# Patient Record
Sex: Female | Born: 1955 | Race: White | Hispanic: No | Marital: Single | Smoking: Never smoker
Health system: Southern US, Community
[De-identification: ages and names within clinical notes are randomized; demographics above are authoritative.]

## PROBLEM LIST (undated history)

## (undated) DIAGNOSIS — E782 Mixed hyperlipidemia: Secondary | ICD-10-CM

## (undated) DIAGNOSIS — Z86010 Personal history of colonic polyps: Secondary | ICD-10-CM

## (undated) DIAGNOSIS — K219 Gastro-esophageal reflux disease without esophagitis: Secondary | ICD-10-CM

## (undated) DIAGNOSIS — M1990 Unspecified osteoarthritis, unspecified site: Secondary | ICD-10-CM

## (undated) HISTORY — DX: Personal history of colonic polyps: Z86.010

## (undated) HISTORY — DX: Mixed hyperlipidemia: E78.2

## (undated) HISTORY — PX: TONSILLECTOMY AND ADENOIDECTOMY: SUR1326

## (undated) HISTORY — DX: Unspecified osteoarthritis, unspecified site: M19.90

## (undated) HISTORY — DX: Gastro-esophageal reflux disease without esophagitis: K21.9

---

## 2001-01-24 ENCOUNTER — Encounter: Admission: RE | Admit: 2001-01-24 | Discharge: 2001-01-24 | Payer: Self-pay | Admitting: Family Medicine

## 2001-01-24 ENCOUNTER — Encounter: Payer: Self-pay | Admitting: Family Medicine

## 2002-04-04 ENCOUNTER — Encounter: Admission: RE | Admit: 2002-04-04 | Discharge: 2002-04-04 | Payer: Self-pay | Admitting: Family Medicine

## 2002-04-04 ENCOUNTER — Encounter: Payer: Self-pay | Admitting: Family Medicine

## 2003-01-01 ENCOUNTER — Other Ambulatory Visit: Admission: RE | Admit: 2003-01-01 | Discharge: 2003-01-01 | Payer: Self-pay | Admitting: *Deleted

## 2004-10-03 HISTORY — PX: MELANOMA EXCISION: SHX5266

## 2005-02-16 ENCOUNTER — Ambulatory Visit (HOSPITAL_COMMUNITY): Admission: RE | Admit: 2005-02-16 | Discharge: 2005-02-16 | Payer: Self-pay | Admitting: Oncology

## 2005-03-13 ENCOUNTER — Ambulatory Visit (HOSPITAL_COMMUNITY): Admission: RE | Admit: 2005-03-13 | Discharge: 2005-03-13 | Payer: Self-pay | Admitting: Diagnostic Radiology

## 2007-04-04 ENCOUNTER — Encounter: Admission: RE | Admit: 2007-04-04 | Discharge: 2007-04-04 | Payer: Self-pay | Admitting: Family Medicine

## 2007-04-04 ENCOUNTER — Ambulatory Visit: Payer: Self-pay | Admitting: Family Medicine

## 2007-04-08 ENCOUNTER — Encounter: Admission: RE | Admit: 2007-04-08 | Discharge: 2007-04-08 | Payer: Self-pay | Admitting: Family Medicine

## 2008-11-19 ENCOUNTER — Other Ambulatory Visit: Admission: RE | Admit: 2008-11-19 | Discharge: 2008-11-19 | Payer: Self-pay | Admitting: Internal Medicine

## 2009-11-18 ENCOUNTER — Other Ambulatory Visit: Admission: RE | Admit: 2009-11-18 | Discharge: 2009-11-18 | Payer: Self-pay | Admitting: Internal Medicine

## 2011-06-28 ENCOUNTER — Other Ambulatory Visit: Payer: Self-pay | Admitting: Family Medicine

## 2011-06-28 DIAGNOSIS — Z1231 Encounter for screening mammogram for malignant neoplasm of breast: Secondary | ICD-10-CM

## 2011-06-29 ENCOUNTER — Ambulatory Visit
Admission: RE | Admit: 2011-06-29 | Discharge: 2011-06-29 | Disposition: A | Discharge status: Home / Self Care | Payer: 59 | Source: Ambulatory Visit | Attending: Family Medicine | Admitting: Family Medicine

## 2011-06-29 DIAGNOSIS — Z1231 Encounter for screening mammogram for malignant neoplasm of breast: Secondary | ICD-10-CM

## 2012-09-07 ENCOUNTER — Ambulatory Visit
Admission: RE | Admit: 2012-09-07 | Discharge: 2012-09-07 | Disposition: A | Discharge status: Home / Self Care | Payer: 59 | Source: Ambulatory Visit | Attending: Family Medicine | Admitting: Family Medicine

## 2012-09-07 ENCOUNTER — Other Ambulatory Visit: Payer: Self-pay | Admitting: Family Medicine

## 2012-09-07 DIAGNOSIS — R059 Cough, unspecified: Secondary | ICD-10-CM

## 2012-09-07 DIAGNOSIS — R062 Wheezing: Secondary | ICD-10-CM

## 2012-09-07 DIAGNOSIS — R05 Cough: Secondary | ICD-10-CM

## 2013-04-15 ENCOUNTER — Other Ambulatory Visit: Payer: Self-pay

## 2013-04-15 DIAGNOSIS — Z1231 Encounter for screening mammogram for malignant neoplasm of breast: Secondary | ICD-10-CM

## 2013-04-17 ENCOUNTER — Other Ambulatory Visit: Payer: Self-pay | Admitting: Family Medicine

## 2013-04-17 ENCOUNTER — Ambulatory Visit
Admission: RE | Admit: 2013-04-17 | Discharge: 2013-04-17 | Disposition: A | Discharge status: Home / Self Care | Payer: 59 | Source: Ambulatory Visit

## 2013-04-17 DIAGNOSIS — Z1231 Encounter for screening mammogram for malignant neoplasm of breast: Secondary | ICD-10-CM

## 2014-05-23 ENCOUNTER — Other Ambulatory Visit: Payer: Self-pay

## 2014-05-23 DIAGNOSIS — Z1231 Encounter for screening mammogram for malignant neoplasm of breast: Secondary | ICD-10-CM

## 2014-05-29 ENCOUNTER — Ambulatory Visit
Admission: RE | Admit: 2014-05-29 | Discharge: 2014-05-29 | Disposition: A | Discharge status: Home / Self Care | Payer: 59 | Source: Ambulatory Visit

## 2014-05-29 DIAGNOSIS — Z1231 Encounter for screening mammogram for malignant neoplasm of breast: Secondary | ICD-10-CM

## 2016-03-16 ENCOUNTER — Other Ambulatory Visit: Payer: Self-pay | Admitting: Family Medicine

## 2016-03-16 DIAGNOSIS — Z1231 Encounter for screening mammogram for malignant neoplasm of breast: Secondary | ICD-10-CM

## 2016-03-30 ENCOUNTER — Ambulatory Visit
Admission: RE | Admit: 2016-03-30 | Discharge: 2016-03-30 | Disposition: A | Discharge status: Home / Self Care | Payer: 59 | Source: Ambulatory Visit | Attending: Family Medicine | Admitting: Family Medicine

## 2016-03-30 DIAGNOSIS — Z1231 Encounter for screening mammogram for malignant neoplasm of breast: Secondary | ICD-10-CM | POA: Diagnosis not present

## 2016-06-29 DIAGNOSIS — D2272 Melanocytic nevi of left lower limb, including hip: Secondary | ICD-10-CM | POA: Diagnosis not present

## 2016-06-29 DIAGNOSIS — B078 Other viral warts: Secondary | ICD-10-CM | POA: Diagnosis not present

## 2016-06-29 DIAGNOSIS — L814 Other melanin hyperpigmentation: Secondary | ICD-10-CM | POA: Diagnosis not present

## 2016-06-29 DIAGNOSIS — D692 Other nonthrombocytopenic purpura: Secondary | ICD-10-CM | POA: Diagnosis not present

## 2016-06-29 DIAGNOSIS — D1801 Hemangioma of skin and subcutaneous tissue: Secondary | ICD-10-CM | POA: Diagnosis not present

## 2016-06-29 DIAGNOSIS — L821 Other seborrheic keratosis: Secondary | ICD-10-CM | POA: Diagnosis not present

## 2016-07-27 DIAGNOSIS — L820 Inflamed seborrheic keratosis: Secondary | ICD-10-CM | POA: Diagnosis not present

## 2016-07-27 DIAGNOSIS — B078 Other viral warts: Secondary | ICD-10-CM | POA: Diagnosis not present

## 2016-07-27 DIAGNOSIS — D485 Neoplasm of uncertain behavior of skin: Secondary | ICD-10-CM | POA: Diagnosis not present

## 2016-09-07 DIAGNOSIS — H52223 Regular astigmatism, bilateral: Secondary | ICD-10-CM | POA: Diagnosis not present

## 2016-09-07 DIAGNOSIS — H5213 Myopia, bilateral: Secondary | ICD-10-CM | POA: Diagnosis not present

## 2016-12-07 DIAGNOSIS — H40043 Steroid responder, bilateral: Secondary | ICD-10-CM | POA: Diagnosis not present

## 2016-12-07 DIAGNOSIS — H40053 Ocular hypertension, bilateral: Secondary | ICD-10-CM | POA: Diagnosis not present

## 2017-03-08 ENCOUNTER — Ambulatory Visit (AMBULATORY_SURGERY_CENTER): Payer: Self-pay | Admitting: *Deleted

## 2017-03-08 VITALS — Ht 69.0 in | Wt 195.0 lb

## 2017-03-08 DIAGNOSIS — Z1211 Encounter for screening for malignant neoplasm of colon: Secondary | ICD-10-CM

## 2017-03-08 NOTE — Progress Notes (Signed)
Denies allergies to eggs or soy products. Denies complications with sedation or anesthesia. Denies O2 use. Denies use of diet or weight loss medications.  Emmi instructions given for colonoscopy.  

## 2017-03-09 ENCOUNTER — Encounter: Payer: Self-pay | Admitting: Internal Medicine

## 2017-03-22 ENCOUNTER — Ambulatory Visit (AMBULATORY_SURGERY_CENTER): Payer: 59 | Admitting: Internal Medicine

## 2017-03-22 ENCOUNTER — Encounter: Payer: Self-pay | Admitting: Internal Medicine

## 2017-03-22 VITALS — BP 142/82 | HR 87 | Temp 97.7°F | Resp 12 | Ht 69.0 in | Wt 195.0 lb

## 2017-03-22 DIAGNOSIS — Z1211 Encounter for screening for malignant neoplasm of colon: Secondary | ICD-10-CM

## 2017-03-22 DIAGNOSIS — D123 Benign neoplasm of transverse colon: Secondary | ICD-10-CM

## 2017-03-22 DIAGNOSIS — Z1212 Encounter for screening for malignant neoplasm of rectum: Secondary | ICD-10-CM | POA: Diagnosis not present

## 2017-03-22 DIAGNOSIS — D122 Benign neoplasm of ascending colon: Secondary | ICD-10-CM | POA: Diagnosis not present

## 2017-03-22 DIAGNOSIS — D120 Benign neoplasm of cecum: Secondary | ICD-10-CM | POA: Diagnosis not present

## 2017-03-22 MED ORDER — SODIUM CHLORIDE 0.9 % IV SOLN: 500.0000 mL | INTRAVENOUS | Status: DC

## 2017-03-22 NOTE — Progress Notes (Signed)
Called to room to assist during endoscopic procedure.  Patient ID and intended procedure confirmed with present staff. Received instructions for my participation in the procedure from the performing physician.  

## 2017-03-22 NOTE — Patient Instructions (Addendum)
I found and removed 10 polyps - largest 15 mm. All look benign. I will let you know pathology results and when to have another routine colonoscopy by mail and/or My Chart.   You also have a condition called diverticulosis - common and not usually a problem. Please read the handout provided.  I appreciate the opportunity to care for you. Gatha Mayer, MD, FACG YOU HAD AN ENDOSCOPIC PROCEDURE TODAY AT [facility]:   Refer to the procedure report that was given to you for any specific questions about what was found during the examination.  If the procedure report does not answer your questions, please call your gastroenterologist to clarify.  If you requested that your care partner not be given the details of your procedure findings, then the procedure report has been included in a sealed envelope for you to review at your convenience later.  YOU SHOULD EXPECT: Some feelings of bloating in the abdomen. Passage of more gas than usual.  Walking can help get rid of the air that was put into your GI tract during the procedure and reduce the bloating. If you had a lower endoscopy (such as a colonoscopy or flexible sigmoidoscopy) you may notice spotting of blood in your stool or on the toilet paper. If you underwent a bowel prep for your procedure, you may not have a normal bowel movement for a few days.  Please Note:  You might notice some irritation and congestion in your nose or some drainage.  This is from the oxygen used during your procedure.  There is no need for concern and it should clear up in a day or so.  SYMPTOMS TO REPORT IMMEDIATELY:   Following lower endoscopy (colonoscopy or flexible sigmoidoscopy):  Excessive amounts of blood in the stool  Significant tenderness or worsening of abdominal pains  Swelling of the abdomen that is new, acute  Fever of 100F or higher  For urgent or emergent issues, a gastroenterologist can be reached at any hour by calling [phone]  [phone].   DIET:  We do recommend a small meal at first, but then you may proceed to your regular diet.  Drink plenty of fluids but you should avoid alcoholic beverages for 24 hours.  MEDICATIONS: Continue present medications.  Please see handouts given to you by your recovery nurse.  ACTIVITY:  You should plan to take it easy for the rest of today and you should NOT DRIVE or use heavy machinery until tomorrow (because of the sedation medicines used during the test).    FOLLOW UP: Our staff will call the number listed on your records the next business day following your procedure to check on you and address any questions or concerns that you may have regarding the information given to you following your procedure. If we do not reach you, we will leave a message.  However, if you are feeling well and you are not experiencing any problems, there is no need to return our call.  We will assume that you have returned to your regular daily activities without incident.  If any biopsies were taken you will be contacted by phone or by letter within the next 1-3 weeks.  Please call us at [phone] if you have not heard about the biopsies in 3 weeks.   Thank you for allowing [place] to provide for your healthcare needs today.   SIGNATURES/CONFIDENTIALITY: You and/or your care partner have signed paperwork which will be entered into your electronic medical  record.  These signatures attest to the fact that that the information above on your After Visit Summary has been reviewed and is understood.  Full responsibility of the confidentiality of this discharge information lies with you and/or your care-partner. 

## 2017-03-22 NOTE — Progress Notes (Signed)
Alert and oriented x3, pleased with MAC, report to RN Sarah 

## 2017-03-22 NOTE — Op Note (Signed)
[place] Patient Name: Jessica Martin Procedure Date: 03/22/2017 1:27 PM MRN: [id number] Endoscopist: Gatha Mayer , MD Age: 61 Referring MD:  Date of Birth: 1955-10-17 Gender: Female Account #: [id number] Procedure:                Colonoscopy Indications:              Screening for colorectal malignant neoplasm, This                            is the patient's first colonoscopy Medicines:                Propofol per Anesthesia, Monitored Anesthesia Care Procedure:                Pre-Anesthesia Assessment:                           - Prior to the procedure, a History and Physical                            was performed, and patient medications and                            allergies were reviewed. The patient's tolerance of                            previous anesthesia was also reviewed. The risks                            and benefits of the procedure and the sedation                            options and risks were discussed with the patient.                            All questions were answered, and informed consent                            was obtained. Prior Anticoagulants: The patient has                            taken no previous anticoagulant or antiplatelet                            agents. ASA Grade Assessment: II - A patient with                            mild systemic disease. After reviewing the risks                            and benefits, the patient was deemed in                            satisfactory condition to undergo the procedure.  After obtaining informed consent, the colonoscope                            was passed under direct vision. Throughout the                            procedure, the patient's blood pressure, pulse, and                            oxygen saturations were monitored continuously. The                            Colonoscope was introduced through the anus and   advanced to the the cecum, identified by                            appendiceal orifice and ileocecal valve. The                            colonoscopy was performed without difficulty. The                            patient tolerated the procedure well. The quality                            of the bowel preparation was good. The ileocecal                            valve, appendiceal orifice, and rectum were                            photographed. Scope In: 1:38:33 PM Scope Out: 2:06:51 PM Scope Withdrawal Time: 0 hours 23 minutes 32 seconds  Total Procedure Duration: 0 hours 28 minutes 18 seconds  Findings:                 The perianal and digital rectal examinations were                            normal.                           Seven flat and sessile polyps were found in the                            transverse colon, ascending colon and cecum. The                            polyps were 5 to 15 mm in size. These polyps were                            removed with a cold snare. Resection and retrieval                            were complete. Verification of patient  identification for the specimen was done. Estimated                            blood loss was minimal.                           Three sessile polyps were found in the cecum. The                            polyps were 2 to 3 mm in size. These polyps were                            removed with a cold biopsy forceps. Resection and                            retrieval were complete. Verification of patient                            identification for the specimen was done. Estimated                            blood loss was minimal.                           Many small and large-mouthed diverticula were found                            in the sigmoid colon. There was narrowing of the                            colon in association with the diverticular opening.                            Scattered diverticula were found in the right colon.                           The exam was otherwise without abnormality on                            direct and retroflexion views. Complications:            No immediate complications. Estimated Blood Loss:     Estimated blood loss was minimal. Impression:               - Seven 5 to 15 mm polyps in the transverse colon,                            in the ascending colon and in the cecum, removed                            with a cold snare. Resected and retrieved.                           - Three 2 to 3  mm polyps in the cecum, removed with                            a cold biopsy forceps. Resected and retrieved.                           - Severe diverticulosis in the sigmoid colon. There                            was narrowing of the colon in association with the                            diverticular opening.                           - Diverticulosis in the right colon.                           - The examination was otherwise normal on direct                            and retroflexion views. Recommendation:           - Patient has a contact number available for                            emergencies. The signs and symptoms of potential                            delayed complications were discussed with the                            patient. Return to normal activities tomorrow.                            Written discharge instructions were provided to the                            patient.                           - Resume previous diet.                           - Continue present medications.                           - Repeat colonoscopy is recommended for                            surveillance. The colonoscopy date will be                            determined after pathology results from today's  exam become available for review. Gatha Mayer, MD 03/22/2017 2:18:38 PM This report has been  signed electronically.

## 2017-03-23 ENCOUNTER — Telehealth: Payer: Self-pay | Admitting: *Deleted

## 2017-03-23 NOTE — Telephone Encounter (Signed)
  Follow up Call-  Call back number 03/22/2017  Post procedure Call Back phone  # [phone]  Permission to leave phone message Yes  Some recent data might be hidden     Patient questions:  Do you have a fever, pain , or abdominal swelling? No. Pain Score  0 *  Have you tolerated food without any problems? Yes.    Have you been able to return to your normal activities? Yes.    Do you have any questions about your discharge instructions: Diet   No. Medications  No. Follow up visit  No.  Do you have questions or concerns about your Care? No.  Actions: * If pain score is 4 or above: No action needed, pain <4.

## 2017-03-23 NOTE — Telephone Encounter (Signed)
  Follow up Call-  Call back number 03/22/2017  Post procedure Call Back phone  # [phone]  Permission to leave phone message Yes  Some recent data might be hidden     No answer, left message.

## 2017-03-29 ENCOUNTER — Encounter: Payer: Self-pay | Admitting: Internal Medicine

## 2017-03-29 DIAGNOSIS — Z860101 Personal history of adenomatous and serrated colon polyps: Secondary | ICD-10-CM | POA: Insufficient documentation

## 2017-03-29 DIAGNOSIS — Z86010 Personal history of colonic polyps: Secondary | ICD-10-CM | POA: Insufficient documentation

## 2017-03-29 HISTORY — DX: Personal history of adenomatous and serrated colon polyps: Z86.0101

## 2017-03-29 HISTORY — DX: Personal history of colonic polyps: Z86.010

## 2017-05-31 DIAGNOSIS — H40013 Open angle with borderline findings, low risk, bilateral: Secondary | ICD-10-CM | POA: Diagnosis not present

## 2017-06-28 ENCOUNTER — Other Ambulatory Visit: Payer: Self-pay | Admitting: Family Medicine

## 2017-06-28 DIAGNOSIS — Z1239 Encounter for other screening for malignant neoplasm of breast: Secondary | ICD-10-CM

## 2017-07-12 ENCOUNTER — Ambulatory Visit
Admission: RE | Admit: 2017-07-12 | Discharge: 2017-07-12 | Disposition: A | Discharge status: Home / Self Care | Payer: 59 | Source: Ambulatory Visit | Attending: Family Medicine | Admitting: Family Medicine

## 2017-07-12 DIAGNOSIS — Z1231 Encounter for screening mammogram for malignant neoplasm of breast: Secondary | ICD-10-CM | POA: Diagnosis not present

## 2017-07-12 DIAGNOSIS — Z1239 Encounter for other screening for malignant neoplasm of breast: Secondary | ICD-10-CM

## 2017-11-16 DIAGNOSIS — H40013 Open angle with borderline findings, low risk, bilateral: Secondary | ICD-10-CM | POA: Diagnosis not present

## 2017-11-16 DIAGNOSIS — H5213 Myopia, bilateral: Secondary | ICD-10-CM | POA: Diagnosis not present

## 2017-11-16 DIAGNOSIS — H52221 Regular astigmatism, right eye: Secondary | ICD-10-CM | POA: Diagnosis not present

## 2017-11-16 DIAGNOSIS — H40053 Ocular hypertension, bilateral: Secondary | ICD-10-CM | POA: Diagnosis not present

## 2017-11-16 DIAGNOSIS — H25013 Cortical age-related cataract, bilateral: Secondary | ICD-10-CM | POA: Diagnosis not present

## 2018-01-05 ENCOUNTER — Other Ambulatory Visit: Payer: Self-pay | Admitting: Occupational Medicine

## 2018-01-05 LAB — CBC WITH DIFFERENTIAL/PLATELET
BASOS PCT: 1.1 %
Basophils Absolute: 62 cells/uL (ref 0–200)
Eosinophils Absolute: 241 cells/uL (ref 15–500)
Eosinophils Relative: 4.3 %
HEMATOCRIT: 41.6 % (ref 38.5–45.0)
Hemoglobin: 14.1 g/dL (ref 13.2–15.5)
LYMPHS ABS: 2083 {cells}/uL (ref 850–3900)
MCH: 30.3 pg (ref 27.0–33.0)
MCHC: 33.9 g/dL (ref 32.0–36.0)
MCV: 89.3 fL (ref 80.0–100.0)
MONOS PCT: 9.3 %
MPV: 9.7 fL (ref 7.5–12.5)
Neutro Abs: 2694 cells/uL (ref 1500–7800)
Neutrophils Relative %: 48.1 %
Platelets: 306 10*3/uL (ref 140–400)
RBC: 4.66 10*6/uL (ref 4.20–5.10)
RDW: 12.2 % (ref 11.0–15.0)
Total Lymphocyte: 37.2 %
WBC mixed population: 521 cells/uL (ref 200–950)
WBC: 5.6 10*3/uL (ref 3.8–10.8)

## 2018-01-05 LAB — COMPLETE METABOLIC PANEL WITH GFR
AG Ratio: 2 (calc) (ref 1.0–2.5)
ALBUMIN MSPROF: 4.3 g/dL (ref 3.6–5.1)
ALT: 13 U/L
AST: 15 U/L (ref 10–35)
Alkaline phosphatase (APISO): 42 U/L (ref 33–130)
BUN: 18 mg/dL (ref 7–25)
CALCIUM: 9.4 mg/dL
CO2: 30 mmol/L (ref 20–32)
CREATININE: 0.9 mg/dL
Chloride: 104 mmol/L (ref 98–110)
Globulin: 2.1 g/dL (calc) (ref 1.9–3.7)
Glucose, Bld: 83 mg/dL (ref 65–99)
POTASSIUM: 4.1 mmol/L (ref 3.5–5.3)
Sodium: 139 mmol/L (ref 135–146)
Total Bilirubin: 1.6 mg/dL — ABNORMAL HIGH (ref 0.2–1.2)
Total Protein: 6.4 g/dL (ref 6.1–8.1)

## 2018-01-05 LAB — LIPID PANEL
CHOL/HDL RATIO: 2.6 (calc) (ref <5.0)
Cholesterol: 210 mg/dL — ABNORMAL HIGH (ref <200)
HDL: 82 mg/dL
LDL CHOLESTEROL (CALC): 106 mg/dL — AB
NON-HDL CHOLESTEROL (CALC): 128 mg/dL (ref <130)
Triglycerides: 123 mg/dL (ref <150)

## 2018-01-05 LAB — TSH: TSH: 1.65 mIU/L (ref 0.40–4.50)

## 2018-02-13 ENCOUNTER — Encounter: Payer: Self-pay | Admitting: Family Medicine

## 2018-03-19 ENCOUNTER — Encounter: Payer: Self-pay | Admitting: Obstetrics & Gynecology

## 2018-03-19 ENCOUNTER — Ambulatory Visit (INDEPENDENT_AMBULATORY_CARE_PROVIDER_SITE_OTHER): Payer: 59 | Admitting: Obstetrics & Gynecology

## 2018-03-19 ENCOUNTER — Other Ambulatory Visit: Payer: Self-pay

## 2018-03-19 ENCOUNTER — Other Ambulatory Visit (HOSPITAL_COMMUNITY)
Admission: RE | Admit: 2018-03-19 | Discharge: 2018-03-19 | Disposition: A | Discharge status: Home / Self Care | Payer: 59 | Source: Ambulatory Visit | Attending: Obstetrics & Gynecology | Admitting: Obstetrics & Gynecology

## 2018-03-19 VITALS — BP 108/62 | HR 60 | Resp 16 | Ht 66.75 in | Wt 182.6 lb

## 2018-03-19 DIAGNOSIS — E2839 Other primary ovarian failure: Secondary | ICD-10-CM | POA: Diagnosis not present

## 2018-03-19 DIAGNOSIS — Z0000 Encounter for general adult medical examination without abnormal findings: Secondary | ICD-10-CM

## 2018-03-19 DIAGNOSIS — Z124 Encounter for screening for malignant neoplasm of cervix: Secondary | ICD-10-CM

## 2018-03-19 DIAGNOSIS — Z23 Encounter for immunization: Secondary | ICD-10-CM | POA: Diagnosis not present

## 2018-03-19 DIAGNOSIS — Z205 Contact with and (suspected) exposure to viral hepatitis: Secondary | ICD-10-CM | POA: Diagnosis not present

## 2018-03-19 DIAGNOSIS — Z01419 Encounter for gynecological examination (general) (routine) without abnormal findings: Secondary | ICD-10-CM | POA: Diagnosis not present

## 2018-03-19 NOTE — Progress Notes (Signed)
62 y.o. G0 SingleCaucasianF here for annual exam.  Doing well.  Has some urge urinary incontinence.  Not interested in treatment at this time.  Denies vaginal bleeding.  Went through menopause at age 31.  Never took HRT.  Here to get "updated" with gyn exam.  Patient's last menstrual period was 10/03/2001 (approximate).          Sexually active: No.  The current method of family planning is abstinence and post menopausal status.    Exercising: Yes.    Walking Smoker:  no  Health Maintenance: Pap:  11/19/08 Atypical Squamous cell.  History of abnormal Pap:  yes MMG:  07/12/17 BIRADS1:Neg  Colonoscopy:  03/22/17 Polyps. F/u this year.  Had 10 adenomatous polyp.   BMD:   Never TDaP:  2009 Pneumonia vaccine(s):  No Shingrix:   No Hep C testing: No Screening Labs: done   reports that she has never smoked. She has never used smokeless tobacco. She reports that she does not use drugs.  Past Medical History:  Diagnosis Date  . 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 GERD (gastroesophageal reflux disease)   . Hx of adenomatous colonic polyps 03/29/2017  . Mixed hyperlipidemia     Past Surgical History:  Procedure Laterality Date  . MELANOMA EXCISION  2006   right neck  . TONSILLECTOMY AND ADENOIDECTOMY  age 33    Current Outpatient Medications  Medication Sig Dispense Refill  . acetaminophen (TYLENOL) 500 MG chewable tablet Chew 500 mg by mouth every 6 (six) hours as needed for pain.    Marland Kitchen ALPRAZolam (XANAX) 0.5 MG tablet Take 0.5 mg by mouth 3 (three) times daily as needed.  2  . rosuvastatin (CRESTOR) 10 MG tablet Take 10 mg by mouth daily.    . Vilazodone HCl (VIIBRYD) 20 MG TABS Take 1 tablet by mouth daily.     Current Facility-Administered Medications  Medication Dose Route Frequency Provider Last Rate Last Dose  . 0.9 %  sodium chloride infusion  500 mL Intravenous Continuous Gatha Mayer, MD        Family History  Problem Relation Age of  Onset  . Lung cancer Mother   . Colon cancer Other 9  . Colon cancer Brother     Review of Systems  All other systems reviewed and are negative.   Exam:   BP 108/62 (BP Location: Right Arm, Patient Position: Sitting, Cuff Size: Large)   Pulse 60   Resp 16   Ht 5' 6.75" (1.695 m)   Wt 182 lb 9.6 oz (82.8 kg)   LMP 10/03/2001 (Approximate)   BMI 28.81 kg/m  Height: 5' 6.75" (169.5 cm)  Ht Readings from Last 3 Encounters:  03/19/18 5' 6.75" (1.695 m)  03/22/17 5\' 9"  (1.753 m)  03/08/17 5\' 9"  (1.753 m)    General appearance: alert, cooperative and appears stated age Head: Normocephalic, without obvious abnormality, atraumatic Neck: no adenopathy, supple, symmetrical, trachea midline and thyroid normal to inspection and palpation Lungs: clear to auscultation bilaterally Breasts: normal appearance, no masses or tenderness Heart: regular rate and rhythm Abdomen: soft, non-tender; bowel sounds normal; no masses,  no organomegaly Extremities: extremities normal, atraumatic, no cyanosis or edema Skin: Skin color, texture, turgor normal. No rashes or lesions Lymph nodes: Cervical, supraclavicular, and axillary nodes normal. No abnormal inguinal nodes palpated Neurologic: Grossly normal  Pelvic: External genitalia:  no lesions  Urethra:  normal appearing urethra with no masses, tenderness or lesions              Bartholins and Skenes: normal                 Vagina: normal appearing vagina with normal color and discharge, no lesions              Cervix: no lesions              Pap taken: Yes.   Bimanual Exam:  Uterus:  normal size, contour, position, consistency, mobility, non-tender              Adnexa: normal adnexa and no mass, fullness, tenderness               Rectovaginal: Confirms               Anus:  normal sphincter tone, no lesions  Chaperone was present for exam.  A:  Well Woman with normal exam PMP, no HRT Mild urinary incontinence Elevated  lipids H/O adenomatous polyps of the colon  P:   Mammogram guidelines reviewed.  UTD. Colonoscopy UTD with Dr. Carlean Purl pap smear and HR HPV obtained today Blood work done at [facility].  Copies provided.  Hep C antibody obtained today.  Also checking Vit D and hepatic panel (due to elevated bilirubin level previously) Discussed doing BMD with next MMG.  Order placed. Tdap updated today.  Shingrix vaccination discussed. Return annually or prn

## 2018-03-20 LAB — HEPATIC FUNCTION PANEL
ALBUMIN: 4.3 g/dL (ref 3.6–4.8)
ALK PHOS: 42 IU/L (ref 39–117)
ALT: 14 IU/L (ref 0–32)
AST: 15 IU/L (ref 0–40)
Bilirubin Total: 0.6 mg/dL (ref 0.0–1.2)
Bilirubin, Direct: 0.14 mg/dL (ref 0.00–0.40)
Total Protein: 6.5 g/dL (ref 6.0–8.5)

## 2018-03-20 LAB — HEPATITIS C ANTIBODY

## 2018-03-20 LAB — VITAMIN D 25 HYDROXY (VIT D DEFICIENCY, FRACTURES): Vit D, 25-Hydroxy: 33.5 ng/mL (ref 30.0–100.0)

## 2018-03-22 ENCOUNTER — Encounter: Payer: Self-pay | Admitting: Obstetrics & Gynecology

## 2018-03-22 LAB — CYTOLOGY - PAP
Diagnosis: NEGATIVE
HPV: NOT DETECTED

## 2018-03-30 ENCOUNTER — Encounter: Payer: 59 | Admitting: Obstetrics & Gynecology

## 2018-05-17 DIAGNOSIS — H40013 Open angle with borderline findings, low risk, bilateral: Secondary | ICD-10-CM | POA: Diagnosis not present

## 2018-05-17 DIAGNOSIS — H40033 Anatomical narrow angle, bilateral: Secondary | ICD-10-CM | POA: Diagnosis not present

## 2018-05-18 DIAGNOSIS — H40013 Open angle with borderline findings, low risk, bilateral: Secondary | ICD-10-CM | POA: Diagnosis not present

## 2018-05-21 ENCOUNTER — Encounter: Payer: Self-pay | Admitting: Internal Medicine

## 2018-08-01 ENCOUNTER — Other Ambulatory Visit: Payer: Self-pay | Admitting: Family Medicine

## 2018-08-01 DIAGNOSIS — Z1231 Encounter for screening mammogram for malignant neoplasm of breast: Secondary | ICD-10-CM

## 2018-09-19 ENCOUNTER — Ambulatory Visit
Admission: RE | Admit: 2018-09-19 | Discharge: 2018-09-19 | Disposition: A | Discharge status: Home / Self Care | Payer: 59 | Source: Ambulatory Visit | Attending: Family Medicine | Admitting: Family Medicine

## 2018-09-19 DIAGNOSIS — Z1231 Encounter for screening mammogram for malignant neoplasm of breast: Secondary | ICD-10-CM

## 2018-11-07 DIAGNOSIS — H40013 Open angle with borderline findings, low risk, bilateral: Secondary | ICD-10-CM | POA: Diagnosis not present

## 2018-11-07 DIAGNOSIS — H5213 Myopia, bilateral: Secondary | ICD-10-CM | POA: Diagnosis not present

## 2018-11-07 DIAGNOSIS — H40053 Ocular hypertension, bilateral: Secondary | ICD-10-CM | POA: Diagnosis not present

## 2018-11-07 DIAGNOSIS — H2513 Age-related nuclear cataract, bilateral: Secondary | ICD-10-CM | POA: Diagnosis not present

## 2018-12-12 ENCOUNTER — Encounter: Payer: Self-pay | Admitting: *Deleted

## 2018-12-12 NOTE — Telephone Encounter (Signed)
Erroneous encounter

## 2018-12-13 ENCOUNTER — Other Ambulatory Visit: Payer: Self-pay

## 2018-12-13 ENCOUNTER — Ambulatory Visit (AMBULATORY_SURGERY_CENTER): Payer: Self-pay | Admitting: *Deleted

## 2018-12-13 VITALS — Ht 68.0 in | Wt 180.0 lb

## 2018-12-13 DIAGNOSIS — Z86010 Personal history of colonic polyps: Secondary | ICD-10-CM

## 2018-12-13 NOTE — Progress Notes (Signed)
Denies allergies to eggs or soy products. Denies complications with sedation or anesthesia. Denies O2 use. Denies use of diet or weight loss medications.  Emmi instructions given for colonoscopy.  

## 2019-01-18 ENCOUNTER — Encounter: Payer: 59 | Admitting: Internal Medicine

## 2019-02-13 ENCOUNTER — Telehealth: Payer: Self-pay | Admitting: *Deleted

## 2019-02-13 NOTE — Telephone Encounter (Signed)
Called patient to reschedule colonoscopy, she needs to look at her calendar to determine what dates best work for her. She will call us back to reschedule.

## 2019-02-14 DIAGNOSIS — L814 Other melanin hyperpigmentation: Secondary | ICD-10-CM | POA: Diagnosis not present

## 2019-02-14 DIAGNOSIS — L309 Dermatitis, unspecified: Secondary | ICD-10-CM | POA: Diagnosis not present

## 2019-02-14 DIAGNOSIS — B351 Tinea unguium: Secondary | ICD-10-CM | POA: Diagnosis not present

## 2019-02-14 DIAGNOSIS — L910 Hypertrophic scar: Secondary | ICD-10-CM | POA: Diagnosis not present

## 2019-02-14 DIAGNOSIS — L738 Other specified follicular disorders: Secondary | ICD-10-CM | POA: Diagnosis not present

## 2019-02-14 DIAGNOSIS — D225 Melanocytic nevi of trunk: Secondary | ICD-10-CM | POA: Diagnosis not present

## 2019-02-14 DIAGNOSIS — D1801 Hemangioma of skin and subcutaneous tissue: Secondary | ICD-10-CM | POA: Diagnosis not present

## 2019-02-14 DIAGNOSIS — L821 Other seborrheic keratosis: Secondary | ICD-10-CM | POA: Diagnosis not present

## 2019-02-14 DIAGNOSIS — L820 Inflamed seborrheic keratosis: Secondary | ICD-10-CM | POA: Diagnosis not present

## 2019-02-19 NOTE — Telephone Encounter (Signed)
Attempted to call to reschedule colonoscopy. She is unable to schedule at this time as she is very busy at work. She states she will get back to [place] to reschedule at another time.

## 2019-04-02 DIAGNOSIS — Z79899 Other long term (current) drug therapy: Secondary | ICD-10-CM | POA: Diagnosis not present

## 2019-05-08 DIAGNOSIS — H40013 Open angle with borderline findings, low risk, bilateral: Secondary | ICD-10-CM | POA: Diagnosis not present

## 2019-06-28 ENCOUNTER — Other Ambulatory Visit: Payer: Self-pay

## 2019-07-02 ENCOUNTER — Ambulatory Visit (INDEPENDENT_AMBULATORY_CARE_PROVIDER_SITE_OTHER): Payer: 59 | Admitting: Obstetrics & Gynecology

## 2019-07-02 ENCOUNTER — Other Ambulatory Visit: Payer: Self-pay

## 2019-07-02 ENCOUNTER — Encounter: Payer: Self-pay | Admitting: Obstetrics & Gynecology

## 2019-07-02 VITALS — BP 132/96 | HR 76 | Temp 96.6°F | Ht 66.5 in | Wt 172.8 lb

## 2019-07-02 DIAGNOSIS — Z01419 Encounter for gynecological examination (general) (routine) without abnormal findings: Secondary | ICD-10-CM

## 2019-07-02 MED ORDER — ALPRAZOLAM 0.5 MG PO TABS: 0.5000 mg | ORAL_TABLET | Freq: Two times a day (BID) | ORAL | 0 refills | Status: DC | PRN

## 2019-07-02 MED ORDER — ROSUVASTATIN CALCIUM 10 MG PO TABS: 10.0000 mg | ORAL_TABLET | Freq: Every day | ORAL | 4 refills | Status: DC

## 2019-07-02 MED ORDER — HYDROCODONE-ACETAMINOPHEN 5-325 MG PO TABS: 1.0000 | ORAL_TABLET | ORAL | 0 refills | Status: DC | PRN

## 2019-07-02 NOTE — Progress Notes (Signed)
63 y.o. G0P0000 Single White or Caucasian female here for annual exam.  Doing well.  Denies vaginal bleeding.  Is going to do her blood work with doctor's day lab work.  She will send this to me.    Dr. Earlie Counts has been doing prescriptions for pt for vicodin for HAs and alprazolam.  She is not currently practicing.  Possible new PCPs discussed.  Does need prescriptions if possible.  Typically gets one per year.    Patient's last menstrual period was 10/03/2001 (approximate).          Sexually active: No.  The current method of family planning is post menopausal status.    Exercising: Yes.    walking Smoker:  no  Health Maintenance: Pap:  03/19/18 Neg. HR HPV:neg  History of abnormal Pap:  Yes, Atypical Squamous cells 2010 MMG:  09/19/18 BIRADS1:neg  Colonoscopy:  03/22/17 polyps. Had follow up due in June but she will schedule this before the end of the year. BMD:   Never TDaP:  2019 Pneumonia vaccine(s):  She will let me know when she wants this. Shingrix:   No Hep C testing: 03/19/18 neg  Screening Labs: PCP   reports that she has never smoked. She has never used smokeless tobacco. She reports current alcohol use. She reports that she does not use drugs.  Past Medical History:  Diagnosis Date  . 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 GERD (gastroesophageal reflux disease)   . Hx of adenomatous colonic polyps 03/29/2017  . Mixed hyperlipidemia     Past Surgical History:  Procedure Laterality Date  . MELANOMA EXCISION  2006   right neck  . TONSILLECTOMY AND ADENOIDECTOMY  age 29    Current Outpatient Medications  Medication Sig Dispense Refill  . acetaminophen (TYLENOL) 500 MG chewable tablet Chew 500 mg by mouth every 6 (six) hours as needed for pain.    Marland Kitchen ALPRAZolam (XANAX) 0.5 MG tablet Take 0.5 mg by mouth 3 (three) times daily as needed.  2  . HYDROcodone-acetaminophen (NORCO/VICODIN) 5-325 MG tablet Take 1 tablet by mouth every 4 (four) hours  as needed. for pain    . rosuvastatin (CRESTOR) 10 MG tablet Take 5 mg by mouth daily.     Marland Kitchen triamcinolone cream (KENALOG) 0.1 % APPLY TO SKIN 2 TIMES DAILY AS DIRECTED FOR 2 WEEKS    . Vilazodone HCl 20 MG TABS Take by mouth.     No current facility-administered medications for this visit.     Family History  Problem Relation Age of Onset  . Lung cancer Mother   . Colon cancer Other 23  . Colon cancer Brother   . Esophageal cancer Neg Hx   . Rectal cancer Neg Hx   . Stomach cancer Neg Hx     Review of Systems  All other systems reviewed and are negative.   Exam:   BP (!) 132/96 (BP Location: Right Arm, Cuff Size: Normal)   Pulse 76   Temp (!) 96.6 F (35.9 C) (Temporal)   Ht 5' 6.5" (1.689 m)   Wt 172 lb 12.8 oz (78.4 kg)   LMP 10/03/2001 (Approximate)   BMI 27.47 kg/m   Height: 5' 6.5" (168.9 cm)  Ht Readings from Last 3 Encounters:  07/02/19 5' 6.5" (1.689 m)  12/13/18 5\' 8"  (1.727 m)  03/19/18 5' 6.75" (1.695 m)    General appearance: alert, cooperative and appears stated age Head: Normocephalic, without obvious abnormality, atraumatic  Neck: no adenopathy, supple, symmetrical, trachea midline and thyroid normal to inspection and palpation Lungs: clear to auscultation bilaterally Breasts: normal appearance, no masses or tenderness Heart: regular rate and rhythm Abdomen: soft, non-tender; bowel sounds normal; no masses,  no organomegaly Extremities: extremities normal, atraumatic, no cyanosis or edema Skin: Skin color, texture, turgor normal. No rashes or lesions Lymph nodes: Cervical, supraclavicular, and axillary nodes normal. No abnormal inguinal nodes palpated Neurologic: Grossly normal   Pelvic: External genitalia:  no lesions              Urethra:  normal appearing urethra with no masses, tenderness or lesions              Bartholins and Skenes: normal                 Vagina: normal appearing vagina with normal color and discharge, no lesions               Cervix: no lesions              Pap taken: No. Bimanual Exam:  Uterus:  normal size, contour, position, consistency, mobility, non-tender              Adnexa: normal adnexa and no mass, fullness, tenderness               Rectovaginal: Confirms               Anus:  normal sphincter tone, no lesions  Chaperone was present for exam.  A:  Well Woman with normal exam PMP, no HRT Migraines Anxiety, worse this year Elevated lipids Mildly elevated BP today  P:   Mammogram guidelines reviewed.  Doing yearly pap smear with neg HR HPV obtained 2019.  Not indicated today. Will do colonoscopy this year.  Aware due. Will have lab work done with doctors' day labs and send me copies Knows to let me know if wants rx for pneumovax Declines shingrix this year Rx for creastor 10mg  1/2 tab daily.  #90/4RF Xanax 0.5mg  1/2 to 1 tab prn.  #60/0RF Vicodin 5/325 1-2 tabs every 6 hrs as needed for headache #20/0RF Declines BMD this year.  Consider next year. Return annually or prn

## 2019-07-05 ENCOUNTER — Other Ambulatory Visit: Payer: Self-pay | Admitting: Occupational Medicine

## 2019-07-06 LAB — COMPLETE METABOLIC PANEL WITHOUT GFR
AG Ratio: 2.1 (calc) (ref 1.0–2.5)
ALT: 11 U/L (ref 6–29)
AST: 13 U/L (ref 10–35)
Albumin: 4.1 g/dL (ref 3.6–5.1)
Alkaline phosphatase (APISO): 32 U/L — ABNORMAL LOW (ref 37–153)
BUN: 12 mg/dL (ref 7–25)
CO2: 27 mmol/L (ref 20–32)
Calcium: 9.5 mg/dL (ref 8.6–10.4)
Chloride: 106 mmol/L (ref 98–110)
Creat: 0.92 mg/dL (ref 0.50–0.99)
GFR, Est African American: 76 mL/min/1.73m2
GFR, Est Non African American: 65 mL/min/1.73m2
Globulin: 2 g/dL (ref 1.9–3.7)
Glucose, Bld: 86 mg/dL (ref 65–99)
Potassium: 4.5 mmol/L (ref 3.5–5.3)
Sodium: 141 mmol/L (ref 135–146)
Total Bilirubin: 0.8 mg/dL (ref 0.2–1.2)
Total Protein: 6.1 g/dL (ref 6.1–8.1)

## 2019-07-06 LAB — CBC WITH DIFFERENTIAL/PLATELET
Absolute Monocytes: 416 {cells}/uL (ref 200–950)
Basophils Absolute: 51 {cells}/uL (ref 0–200)
Basophils Relative: 0.9 %
Eosinophils Absolute: 120 {cells}/uL (ref 15–500)
Eosinophils Relative: 2.1 %
HCT: 42.3 % (ref 35.0–45.0)
Hemoglobin: 14 g/dL (ref 11.7–15.5)
Lymphs Abs: 1243 {cells}/uL (ref 850–3900)
MCH: 29.6 pg (ref 27.0–33.0)
MCHC: 33.1 g/dL (ref 32.0–36.0)
MCV: 89.4 fL (ref 80.0–100.0)
MPV: 9.6 fL (ref 7.5–12.5)
Monocytes Relative: 7.3 %
Neutro Abs: 3870 {cells}/uL (ref 1500–7800)
Neutrophils Relative %: 67.9 %
Platelets: 304 Thousand/uL (ref 140–400)
RBC: 4.73 Million/uL (ref 3.80–5.10)
RDW: 12.1 % (ref 11.0–15.0)
Total Lymphocyte: 21.8 %
WBC: 5.7 Thousand/uL (ref 3.8–10.8)

## 2019-07-06 LAB — TSH: TSH: 0.43 m[IU]/L (ref 0.40–4.50)

## 2019-07-06 LAB — LIPID PANEL
Cholesterol: 205 mg/dL — ABNORMAL HIGH
HDL: 86 mg/dL
LDL Cholesterol (Calc): 97 mg/dL
Non-HDL Cholesterol (Calc): 119 mg/dL
Total CHOL/HDL Ratio: 2.4 (calc)
Triglycerides: 119 mg/dL

## 2019-11-13 DIAGNOSIS — H40053 Ocular hypertension, bilateral: Secondary | ICD-10-CM | POA: Diagnosis not present

## 2019-11-13 DIAGNOSIS — H40013 Open angle with borderline findings, low risk, bilateral: Secondary | ICD-10-CM | POA: Diagnosis not present

## 2020-03-04 ENCOUNTER — Other Ambulatory Visit: Payer: Self-pay | Admitting: Obstetrics & Gynecology

## 2020-03-04 DIAGNOSIS — Z1231 Encounter for screening mammogram for malignant neoplasm of breast: Secondary | ICD-10-CM

## 2020-03-11 ENCOUNTER — Ambulatory Visit
Admission: RE | Admit: 2020-03-11 | Discharge: 2020-03-11 | Disposition: A | Discharge status: Home / Self Care | Payer: 59 | Source: Ambulatory Visit | Attending: Obstetrics & Gynecology | Admitting: Obstetrics & Gynecology

## 2020-03-11 ENCOUNTER — Other Ambulatory Visit: Payer: Self-pay

## 2020-03-11 DIAGNOSIS — Z1231 Encounter for screening mammogram for malignant neoplasm of breast: Secondary | ICD-10-CM | POA: Diagnosis not present

## 2020-04-08 DIAGNOSIS — B351 Tinea unguium: Secondary | ICD-10-CM | POA: Diagnosis not present

## 2020-04-08 DIAGNOSIS — Z85820 Personal history of malignant melanoma of skin: Secondary | ICD-10-CM | POA: Diagnosis not present

## 2020-04-08 DIAGNOSIS — L738 Other specified follicular disorders: Secondary | ICD-10-CM | POA: Diagnosis not present

## 2020-04-08 DIAGNOSIS — D692 Other nonthrombocytopenic purpura: Secondary | ICD-10-CM | POA: Diagnosis not present

## 2020-04-08 DIAGNOSIS — L821 Other seborrheic keratosis: Secondary | ICD-10-CM | POA: Diagnosis not present

## 2020-04-08 DIAGNOSIS — L820 Inflamed seborrheic keratosis: Secondary | ICD-10-CM | POA: Diagnosis not present

## 2020-04-08 DIAGNOSIS — L814 Other melanin hyperpigmentation: Secondary | ICD-10-CM | POA: Diagnosis not present

## 2020-04-08 DIAGNOSIS — D2272 Melanocytic nevi of left lower limb, including hip: Secondary | ICD-10-CM | POA: Diagnosis not present

## 2020-04-08 DIAGNOSIS — D1801 Hemangioma of skin and subcutaneous tissue: Secondary | ICD-10-CM | POA: Diagnosis not present

## 2020-07-02 NOTE — Progress Notes (Signed)
64 y.o. G0P0000 Single White or Caucasian female here for annual exam.  Doing well.  The year has been stressful.  Denies vaginal bleeding.  Does not have PCP.  Did blood work with doctor's day.  Will fax it to me.    Patient's last menstrual period was 10/03/2001 (approximate).          Sexually active: No.  The current method of family planning is post menopausal status.    Exercising: Yes.    walking Smoker:  no  Health Maintenance: MMG:  03-11-2020 category b density birads 1:neg BMD:  none Last pap smear:  03-19-18 neg HPV HR neg.   H/o abnormal pap smear:  Yes atypical squamous cells 2010 MMG:  03/11/2020, Bi rads 1 Colonoscopy:  03/22/2017.  Follow up 1 year.  Aware this is due.  Declines help with scheduling BMD:   Never.  Ordered for pt TDaP:  2019 Pneumonia vaccine(s):  rx will be given Shingrix:   Declines for now Hep C testing: 03/2018 neg Screening Labs: doctor's day   reports that she has never smoked. She has never used smokeless tobacco. She reports current alcohol use. She reports that she does not use drugs.  Past Medical History:  Diagnosis Date  . 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 GERD (gastroesophageal reflux disease)   . Hx of adenomatous colonic polyps 03/29/2017  . Mixed hyperlipidemia     Past Surgical History:  Procedure Laterality Date  . MELANOMA EXCISION  2006   right neck  . TONSILLECTOMY AND ADENOIDECTOMY  age 12    Current Outpatient Medications  Medication Sig Dispense Refill  . acetaminophen (TYLENOL) 500 MG chewable tablet Chew 500 mg by mouth every 6 (six) hours as needed for pain.    Marland Kitchen ALPRAZolam (XANAX) 0.5 MG tablet Take 1 tablet (0.5 mg total) by mouth 2 (two) times daily as needed. 60 tablet 0  . HYDROcodone-acetaminophen (NORCO/VICODIN) 5-325 MG tablet Take 1 tablet by mouth every 4 (four) hours as needed. for pain 20 tablet 0  . rosuvastatin (CRESTOR) 10 MG tablet Take 1 tablet (10 mg total) by mouth  daily. 90 tablet 4  . terbinafine (LAMISIL) 250 MG tablet     . triamcinolone cream (KENALOG) 0.1 % APPLY TO SKIN 2 TIMES DAILY AS DIRECTED FOR 2 WEEKS    . Vilazodone HCl 20 MG TABS Take by mouth.     No current facility-administered medications for this visit.    Family History  Problem Relation Age of Onset  . Lung cancer Mother   . Colon cancer Other 64  . Colon cancer Brother   . Esophageal cancer Neg Hx   . Rectal cancer Neg Hx   . Stomach cancer Neg Hx     Review of Systems  All other systems reviewed and are negative.   Exam:   BP 114/70   Pulse 68   Resp 16   Ht 5' 6.25" (1.683 m)   Wt 177 lb (80.3 kg)   LMP 10/03/2001 (Approximate)   BMI 28.35 kg/m   Height: 5' 6.25" (168.3 cm)  General appearance: alert, cooperative and appears stated age Head: Normocephalic, without obvious abnormality, atraumatic Neck: no adenopathy, supple, symmetrical, trachea midline and thyroid normal to inspection and palpation Lungs: clear to auscultation bilaterally Breasts: normal appearance, no masses or tenderness Heart: regular rate and rhythm Abdomen: soft, non-tender; bowel sounds normal; no masses,  no organomegaly Extremities: extremities normal, atraumatic, no  cyanosis or edema Skin: Skin color, texture, turgor normal. No rashes or lesions Lymph nodes: Cervical, supraclavicular, and axillary nodes normal. No abnormal inguinal nodes palpated Neurologic: Grossly normal   Pelvic: External genitalia:  no lesions              Urethra:  normal appearing urethra with no masses, tenderness or lesions              Bartholins and Skenes: normal                 Vagina: normal appearing vagina with normal color and discharge, no lesions              Cervix: no lesions              Pap taken: No. Bimanual Exam:  Uterus:  normal size, contour, position, consistency, mobility, non-tender              Adnexa: normal adnexa and no mass, fullness, tenderness               Rectovaginal:  Confirms               Anus:  normal sphincter tone, no lesions  Chaperone, Karmen Bongo, RN, was present for exam.  A:  Well Woman with normal exam PMP, no HRT H/o migraines Anxiety Elevated lipids  P:   Mammogram guidelines reviewed.  Doing yearly. pap smear neg with neg HR HPV 2019.  Shared decision making regarding pap smears discussed today.  She desires to have one next year. Colonoscopy due.  Pt will schedule Rx for pneumovax given to have done when convenient BMD order placed to have done with MMG in 2022 Rx for Crestor 10mg  1 tab daily.  #90/4RF Xanax 0.5mg  1/2 to 1 tab prn.  #60/0RF Vicodin 5/325 1-2 tabs every 6 hrs as needed for headache #20/0RF Return annually or prn

## 2020-07-03 ENCOUNTER — Encounter: Payer: Self-pay | Admitting: Obstetrics & Gynecology

## 2020-07-03 ENCOUNTER — Ambulatory Visit (INDEPENDENT_AMBULATORY_CARE_PROVIDER_SITE_OTHER): Payer: 59 | Admitting: Obstetrics & Gynecology

## 2020-07-03 ENCOUNTER — Other Ambulatory Visit: Payer: Self-pay

## 2020-07-03 VITALS — BP 114/70 | HR 68 | Resp 16 | Ht 66.25 in | Wt 177.0 lb

## 2020-07-03 DIAGNOSIS — E2839 Other primary ovarian failure: Secondary | ICD-10-CM

## 2020-07-03 DIAGNOSIS — Z01419 Encounter for gynecological examination (general) (routine) without abnormal findings: Secondary | ICD-10-CM | POA: Diagnosis not present

## 2020-07-03 DIAGNOSIS — E785 Hyperlipidemia, unspecified: Secondary | ICD-10-CM

## 2020-07-03 DIAGNOSIS — E559 Vitamin D deficiency, unspecified: Secondary | ICD-10-CM

## 2020-07-03 MED ORDER — HYDROCODONE-ACETAMINOPHEN 5-325 MG PO TABS
1.0000 | ORAL_TABLET | ORAL | 0 refills | Status: DC | PRN
Start: 2020-07-03 — End: 2021-07-05

## 2020-07-03 MED ORDER — ALPRAZOLAM 0.5 MG PO TABS
0.5000 mg | ORAL_TABLET | Freq: Two times a day (BID) | ORAL | 0 refills | Status: DC | PRN
Start: 2020-07-03 — End: 2021-07-05

## 2020-07-03 MED ORDER — ROSUVASTATIN CALCIUM 10 MG PO TABS
10.0000 mg | ORAL_TABLET | Freq: Every day | ORAL | 4 refills | Status: DC
Start: 2020-07-03 — End: 2021-07-05

## 2020-07-04 LAB — VITAMIN D 25 HYDROXY (VIT D DEFICIENCY, FRACTURES): Vit D, 25-Hydroxy: 22.5 ng/mL — ABNORMAL LOW (ref 30.0–100.0)

## 2020-07-07 ENCOUNTER — Encounter: Payer: Self-pay | Admitting: Obstetrics & Gynecology

## 2020-07-08 ENCOUNTER — Telehealth: Payer: Self-pay

## 2020-07-08 DIAGNOSIS — E559 Vitamin D deficiency, unspecified: Secondary | ICD-10-CM

## 2020-07-08 MED ORDER — VITAMIN D (ERGOCALCIFEROL) 1.25 MG (50000 UNIT) PO CAPS: 50000.0000 [IU] | ORAL_CAPSULE | ORAL | 0 refills | Status: DC

## 2020-07-08 NOTE — Telephone Encounter (Signed)
Pt sent following mychart message:  Received: Jessica, Martin "Dr. Renold Genta"  P [facility] Clinical Pool I would love to take high dose weekly Vit D and have level repeated in a few weeks. My pharmacy is [facility]. many thanks for taking care of me! MJB

## 2020-07-08 NOTE — Telephone Encounter (Signed)
AEX 07/03/20 PMP, no HRT Vit D Def- lab 10/1=22.5  Per result notes from 10/1:  Dr. Renold Genta, Your Vit D level is 22. Do you want the prescription dosage for this and have the level repeated in 12 weeks or do you just want to take over the counter Vit D. Thank you for faxing your lab work last week. I did receive it and we will scan it in Epic. Just let me know what is easiest for you. Thanks.  Jessica Martin    Pt requesting Vit D Rx. Per result notes, Rx Vit D 50K once a week for 3 months sent to pharmacy. # 12, 0RF.   Left message for pt to return call to schedule 3 month repeat Vit D level.  Orders placed for future.

## 2020-11-25 DIAGNOSIS — H40013 Open angle with borderline findings, low risk, bilateral: Secondary | ICD-10-CM | POA: Diagnosis not present

## 2020-11-25 DIAGNOSIS — H2513 Age-related nuclear cataract, bilateral: Secondary | ICD-10-CM | POA: Diagnosis not present

## 2020-11-25 DIAGNOSIS — H40053 Ocular hypertension, bilateral: Secondary | ICD-10-CM | POA: Diagnosis not present

## 2021-02-02 ENCOUNTER — Other Ambulatory Visit (HOSPITAL_COMMUNITY): Payer: Self-pay | Admitting: *Deleted

## 2021-02-08 ENCOUNTER — Ambulatory Visit (HOSPITAL_COMMUNITY)
Admission: RE | Admit: 2021-02-08 | Discharge: 2021-02-08 | Disposition: A | Discharge status: Home / Self Care | Payer: 59 | Source: Ambulatory Visit | Attending: Cardiology | Admitting: Cardiology

## 2021-02-08 ENCOUNTER — Other Ambulatory Visit: Payer: Self-pay

## 2021-03-03 ENCOUNTER — Ambulatory Visit: Payer: 59 | Attending: Internal Medicine

## 2021-03-03 DIAGNOSIS — Z23 Encounter for immunization: Secondary | ICD-10-CM

## 2021-03-03 NOTE — Progress Notes (Signed)
   Covid-19 Vaccination Clinic  Name:  Jessica Martin    MRN: [id number] DOB: 1956/03/21  03/03/2021  [name] was observed post Covid-19 immunization for 15 minutes without incident. She was provided with Vaccine Information Sheet and instruction to access the V-Safe system.   [name] was instructed to call 911 with any severe reactions post vaccine: Marland Kitchen Difficulty breathing  . Swelling of face and throat  . A fast heartbeat  . A bad rash all over body  . Dizziness and weakness   Immunizations Administered    Name Date Dose VIS Date Route   PFIZER Comrnaty(Gray TOP) Covid-19 Vaccine 03/03/2021  9:20 AM 0.3 mL 09/10/2020 Intramuscular   Manufacturer: [employer]   Lot: [id number]   NDC: [id number]

## 2021-03-05 ENCOUNTER — Other Ambulatory Visit (HOSPITAL_COMMUNITY): Payer: Self-pay

## 2021-03-05 MED ORDER — COVID-19 MRNA VAC-TRIS(PFIZER) 30 MCG/0.3ML IM SUSP
INTRAMUSCULAR | 0 refills | Status: DC
  Filled 2021-03-05: qty 0.3, 17d supply, fill #0

## 2021-03-10 ENCOUNTER — Other Ambulatory Visit (HOSPITAL_COMMUNITY): Payer: Self-pay

## 2021-03-17 ENCOUNTER — Ambulatory Visit
Admission: RE | Admit: 2021-03-17 | Discharge: 2021-03-17 | Disposition: A | Discharge status: Home / Self Care | Payer: 59 | Source: Ambulatory Visit | Attending: Physical Medicine and Rehabilitation | Admitting: Physical Medicine and Rehabilitation

## 2021-03-17 ENCOUNTER — Other Ambulatory Visit: Payer: Self-pay | Admitting: Physical Medicine and Rehabilitation

## 2021-03-17 ENCOUNTER — Other Ambulatory Visit: Payer: Self-pay

## 2021-03-17 DIAGNOSIS — Z1231 Encounter for screening mammogram for malignant neoplasm of breast: Secondary | ICD-10-CM

## 2021-06-28 ENCOUNTER — Encounter (HOSPITAL_BASED_OUTPATIENT_CLINIC_OR_DEPARTMENT_OTHER): Payer: Self-pay

## 2021-07-05 ENCOUNTER — Ambulatory Visit (INDEPENDENT_AMBULATORY_CARE_PROVIDER_SITE_OTHER): Payer: 59 | Admitting: Obstetrics & Gynecology

## 2021-07-05 ENCOUNTER — Other Ambulatory Visit: Payer: Self-pay

## 2021-07-05 ENCOUNTER — Encounter (HOSPITAL_BASED_OUTPATIENT_CLINIC_OR_DEPARTMENT_OTHER): Payer: Self-pay | Admitting: Obstetrics & Gynecology

## 2021-07-05 VITALS — BP 145/90 | HR 92 | Ht 66.0 in | Wt 174.8 lb

## 2021-07-05 DIAGNOSIS — F419 Anxiety disorder, unspecified: Secondary | ICD-10-CM | POA: Diagnosis not present

## 2021-07-05 DIAGNOSIS — Z01419 Encounter for gynecological examination (general) (routine) without abnormal findings: Secondary | ICD-10-CM | POA: Diagnosis not present

## 2021-07-05 DIAGNOSIS — R931 Abnormal findings on diagnostic imaging of heart and coronary circulation: Secondary | ICD-10-CM

## 2021-07-05 DIAGNOSIS — G43909 Migraine, unspecified, not intractable, without status migrainosus: Secondary | ICD-10-CM | POA: Diagnosis not present

## 2021-07-05 DIAGNOSIS — E785 Hyperlipidemia, unspecified: Secondary | ICD-10-CM | POA: Diagnosis not present

## 2021-07-05 DIAGNOSIS — Z780 Asymptomatic menopausal state: Secondary | ICD-10-CM

## 2021-07-05 DIAGNOSIS — R52 Pain, unspecified: Secondary | ICD-10-CM

## 2021-07-05 MED ORDER — ALPRAZOLAM 0.5 MG PO TABS: 0.5000 mg | ORAL_TABLET | Freq: Two times a day (BID) | ORAL | 0 refills | Status: DC | PRN

## 2021-07-05 MED ORDER — HYDROCODONE-ACETAMINOPHEN 5-325 MG PO TABS: 1.0000 | ORAL_TABLET | ORAL | 0 refills | Status: DC | PRN

## 2021-07-05 NOTE — Progress Notes (Signed)
65 y.o. G0P0000 Single White or Caucasian female here for annual exam.  Having some stressors at work with [employer].  Had coronary CT doen this summer with [facility].  Was in the 93rd percentile.  Is on full dosed Crestor.    Patient's last menstrual period was 10/03/2001 (approximate).          Sexually active: No.  The current method of family planning is post menopausal status.    Exercising: No.   walking Smoker:  no  Health Maintenance: Pap:  03/19/2018  Negative History of abnormal Pap:  no MMG:  03/17/2021 Negative Colonoscopy:  03/22/2017, follow up 1 year was recommended BMD:   ordered and pending TDaP:  03/2018 Pneumonia vaccine(s):   Shingrix:   hasn't done this Screening Labs: done at doctor's day   reports that she has never smoked. She has never used smokeless tobacco. She reports current alcohol use. She reports that she does not use drugs.  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 60 tablet 0   COVID-19 mRNA Vac-TriS, Pfizer, SUSP injection Inject into the muscle. 0.3 mL 0   HYDROcodone-acetaminophen (NORCO/VICODIN) 5-325 MG tablet Take 1 tablet by mouth every 4 (four) hours as needed. for pain 20 tablet 0   rosuvastatin (CRESTOR) 40 MG tablet Take 40 mg by mouth daily.     triamcinolone cream (KENALOG) 0.1 % APPLY TO SKIN 2 TIMES DAILY AS DIRECTED FOR 2 WEEKS     Vilazodone HCl 20 MG TABS Take by mouth.     acetaminophen (TYLENOL) 500 MG chewable tablet Chew 500 mg by mouth every 6 (six)  hours as needed for pain.     rosuvastatin (CRESTOR) 10 MG tablet Take 1 tablet (10 mg total) by mouth daily. 90 tablet 4   terbinafine (LAMISIL) 250 MG tablet      Vitamin D, Ergocalciferol, (DRISDOL) 1.25 MG (50000 UNIT) CAPS capsule Take 1 capsule (50,000 Units total) by mouth every 7 (seven) days. 12 capsule 0   No current facility-administered medications for this visit.    Family History  Problem Relation Age of Onset   Lung cancer Mother    Colon cancer Other 73   Colon cancer Brother    Esophageal cancer Neg Hx    Rectal cancer Neg Hx    Stomach cancer Neg Hx     Review of Systems  All other systems reviewed and are negative.  Exam:   BP (!) 145/90 (BP Location: Right Arm, Patient Position: Sitting, Cuff Size: Large)   Pulse 92   Ht 5\' 6"  (1.676 m)   Wt 174 lb 12.8 oz (79.3 kg)   LMP 10/03/2001 (Approximate)   BMI 28.21 kg/m   Height: 5\' 6"  (167.6 cm)  General appearance: alert, cooperative and appears stated age Head: Normocephalic, without obvious abnormality, atraumatic Neck: no adenopathy, supple, symmetrical, trachea midline and thyroid normal to inspection and palpation Lungs: clear to auscultation bilaterally Breasts: normal appearance, no masses  or tenderness Heart: regular rate and rhythm Abdomen: soft, non-tender; bowel sounds normal; no masses,  no organomegaly Extremities: extremities normal, atraumatic, no cyanosis or edema Skin: Skin color, texture, turgor normal. No rashes or lesions Lymph nodes: Cervical, supraclavicular, and axillary nodes normal. No abnormal inguinal nodes palpated Neurologic: Grossly normal   Pelvic: External genitalia:  no lesions              Urethra:  normal appearing urethra with no masses, tenderness or lesions              Bartholins and Skenes: normal                 Vagina: normal appearing vagina with normal color and no discharge, no lesions              Cervix: no lesions              Pap taken: No. Bimanual  Exam:  Uterus:  normal size, contour, position, consistency, mobility, non-tender              Adnexa: normal adnexa and no mass, fullness, tenderness               Rectovaginal: Confirms               Anus:  normal sphincter tone, no lesions  Chaperone, Octaviano Batty, CMA, was present for exam.  Assessment/Plan: 1. Well woman exam with routine gynecological exam - pap neg with neg HR HPV 2019.  Low risk.  Will stop pap smears at this point. - MMG 03/2021 - BMD discussed.  Pt does not desire to proceed at this point.  Will let me know if wants a new order placed - colonoscopy 03/2017 - Care gaps updated/reviewed - labs done with [facility].  Will be scanned into Epic.  2. Postmenopausal - no HRT  3. Anxiety - ALPRAZolam (XANAX) 0.5 MG tablet; Take 1 tablet (0.5 mg total) by mouth 2 (two) times daily as needed.  Dispense: 60 tablet; Refill: 0 - pt receives one rx a year  4. Migraine without status migrainosus, not intractable, unspecified migraine type - pt receives one rx per years - HYDROcodone-acetaminophen (NORCO/VICODIN) 5-325 MG tablet; Take 1 tablet by mouth every 4 (four) hours as needed. for pain  Dispense: 20 tablet; Refill: 0  5. Elevated lipids - on Crestor  6. Elevated coronary artery calcium score - formal consult discussed.  Declined for now as she has reviewed with cardiologist.

## 2021-07-07 ENCOUNTER — Ambulatory Visit: Payer: 59 | Attending: Internal Medicine

## 2021-07-07 ENCOUNTER — Other Ambulatory Visit (HOSPITAL_BASED_OUTPATIENT_CLINIC_OR_DEPARTMENT_OTHER): Payer: Self-pay

## 2021-07-07 DIAGNOSIS — Z23 Encounter for immunization: Secondary | ICD-10-CM

## 2021-07-07 MED ORDER — PFIZER COVID-19 VAC BIVALENT 30 MCG/0.3ML IM SUSP
INTRAMUSCULAR | 0 refills | Status: DC
  Filled 2021-07-07: qty 0.3, 1d supply, fill #0

## 2021-07-07 NOTE — Progress Notes (Signed)
   Covid-19 Vaccination Clinic  Name:  ARIELE VIDRIO    MRN: [id number] DOB: 1955-10-21  07/07/2021  Ms. Brasington was observed post Covid-19 immunization for 15 minutes without incident. She was provided with Vaccine Information Sheet and instruction to access the V-Safe system.   Ms. Blow was instructed to call 911 with any severe reactions post vaccine: Difficulty breathing  Swelling of face and throat  A fast heartbeat  A bad rash all over body  Dizziness and weakness

## 2021-07-08 ENCOUNTER — Encounter (HOSPITAL_BASED_OUTPATIENT_CLINIC_OR_DEPARTMENT_OTHER): Payer: Self-pay

## 2021-07-08 ENCOUNTER — Encounter (HOSPITAL_BASED_OUTPATIENT_CLINIC_OR_DEPARTMENT_OTHER): Payer: Self-pay | Admitting: *Deleted

## 2021-08-13 DIAGNOSIS — D1801 Hemangioma of skin and subcutaneous tissue: Secondary | ICD-10-CM | POA: Diagnosis not present

## 2021-08-13 DIAGNOSIS — L820 Inflamed seborrheic keratosis: Secondary | ICD-10-CM | POA: Diagnosis not present

## 2021-08-13 DIAGNOSIS — L821 Other seborrheic keratosis: Secondary | ICD-10-CM | POA: Diagnosis not present

## 2021-09-10 ENCOUNTER — Telehealth: Payer: 59 | Admitting: Physician Assistant

## 2021-09-10 DIAGNOSIS — U071 COVID-19: Secondary | ICD-10-CM

## 2021-09-10 MED ORDER — NIRMATRELVIR/RITONAVIR (PAXLOVID)TABLET: 3.0000 | ORAL_TABLET | Freq: Two times a day (BID) | ORAL | 0 refills | Status: AC

## 2021-09-10 MED ORDER — BENZONATATE 100 MG PO CAPS: 100.0000 mg | ORAL_CAPSULE | Freq: Three times a day (TID) | ORAL | 0 refills | Status: DC | PRN

## 2021-09-10 NOTE — Patient Instructions (Signed)
Elby Showers, thank you for joining Leeanne Rio, PA-C for today's virtual visit.  While this provider is not your primary care provider (PCP), if your PCP is located in our provider database this encounter information will be shared with them immediately following your visit.  Consent: (Patient) Jessica Martin provided verbal consent for this virtual visit at the beginning of the encounter.  Current Medications:  Current Outpatient Medications:    ALPRAZolam (XANAX) 0.5 MG tablet, Take 1 tablet (0.5 mg total) by mouth 2 (two) times daily as needed., Disp: 60 tablet, Rfl: 0   COVID-19 mRNA bivalent vaccine, Pfizer, (PFIZER COVID-19 VAC BIVALENT) injection, Inject into the muscle., Disp: 0.3 mL, Rfl: 0   COVID-19 mRNA Vac-TriS, Pfizer, SUSP injection, Inject into the muscle., Disp: 0.3 mL, Rfl: 0   HYDROcodone-acetaminophen (NORCO/VICODIN) 5-325 MG tablet, Take 1 tablet by mouth every 4 (four) hours as needed. for pain, Disp: 20 tablet, Rfl: 0   rosuvastatin (CRESTOR) 40 MG tablet, Take 40 mg by mouth daily., Disp: , Rfl:    triamcinolone cream (KENALOG) 0.1 %, APPLY TO SKIN 2 TIMES DAILY AS DIRECTED FOR 2 WEEKS, Disp: , Rfl:    Vilazodone HCl 20 MG TABS, Take by mouth., Disp: , Rfl:    Medications ordered in this encounter:  No orders of the defined types were placed in this encounter.    *If you need refills on other medications prior to your next appointment, please contact your pharmacy*  Follow-Up: Call back or seek an in-person evaluation if the symptoms worsen or if the condition fails to improve as anticipated.  Other Instructions Please keep well-hydrated and get plenty of rest. Start a saline nasal rinse to flush out your nasal passages. You can use plain Mucinex to help thin congestion. If you have a humidifier, running in the bedroom at night. I want you to start OTC vitamin D3 1000 units daily, vitamin C 1000 mg daily, and a zinc supplement. Please take prescribed  medications as directed.  Make sure to refrain from taking today's Crestor so you can start the antiviral today. Stay off of Crestor while taking Paxlovid and for 5 additional days before restarting as dicussed.   You have been enrolled in a MyChart symptom monitoring program. Please answer these questions daily so we can keep track of how you are doing.  You were to quarantine for 5 days from onset of your symptoms.  After day 5, if you have had no fever and you are feeling better, you can end quarantine but need to mask for an additional 5 days. After day 5 if you have a fever or are having significant symptoms, please quarantine for full 10 days.  If you note any worsening of symptoms, any significant shortness of breath or any chest pain, please seek ER evaluation ASAP.  Please do not delay care!  COVID-19: What to Do if You Are Sick If you test positive and are an older adult or someone who is at high risk of getting very sick from COVID-19, treatment may be available. Contact a healthcare provider right away after a positive test to determine if you are eligible, even if your symptoms are mild right now. You can also visit a Test to Treat location and, if eligible, receive a prescription from a provider. Don't delay: Treatment must be started within the first few days to be effective. If you have a fever, cough, or other symptoms, you might have COVID-19. Most people have mild  illness and are able to recover at home. If you are sick: Keep track of your symptoms. If you have an emergency warning sign (including trouble breathing), call 911. Steps to help prevent the spread of COVID-19 if you are sick If you are sick with COVID-19 or think you might have COVID-19, follow the steps below to care for yourself and to help protect other people in your home and community. Stay home except to get medical care Stay home. Most people with COVID-19 have mild illness and can recover at home without  medical care. Do not leave your home, except to get medical care. Do not visit public areas and do not go to places where you are unable to wear a mask. Take care of yourself. Get rest and stay hydrated. Take over-the-counter medicines, such as acetaminophen, to help you feel better. Stay in touch with your doctor. Call before you get medical care. Be sure to get care if you have trouble breathing, or have any other emergency warning signs, or if you think it is an emergency. Avoid public transportation, ride-sharing, or taxis if possible. Get tested If you have symptoms of COVID-19, get tested. While waiting for test results, stay away from others, including staying apart from those living in your household. Get tested as soon as possible after your symptoms start. Treatments may be available for people with COVID-19 who are at risk for becoming very sick. Don't delay: Treatment must be started early to be effective--some treatments must begin within 5 days of your first symptoms. Contact your healthcare provider right away if your test result is positive to determine if you are eligible. Self-tests are one of several options for testing for the virus that causes COVID-19 and may be more convenient than laboratory-based tests and point-of-care tests. Ask your healthcare provider or your local health department if you need help interpreting your test results. You can visit your state, tribal, local, and territorial health department's website to look for the latest local information on testing sites. Separate yourself from other people As much as possible, stay in a specific room and away from other people and pets in your home. If possible, you should use a separate bathroom. If you need to be around other people or animals in or outside of the home, wear a well-fitting mask. Tell your close contacts that they may have been exposed to COVID-19. An infected person can spread COVID-19 starting 48 hours (or  2 days) before the person has any symptoms or tests positive. By letting your close contacts know they may have been exposed to COVID-19, you are helping to protect everyone. See COVID-19 and Animals if you have questions about pets. If you are diagnosed with COVID-19, someone from the health department may call you. Answer the call to slow the spread. Monitor your symptoms Symptoms of COVID-19 include fever, cough, or other symptoms. Follow care instructions from your healthcare provider and local health department. Your local health authorities may give instructions on checking your symptoms and reporting information. When to seek emergency medical attention Look for emergency warning signs* for COVID-19. If someone is showing any of these signs, seek emergency medical care immediately: Trouble breathing Persistent pain or pressure in the chest New confusion Inability to wake or stay awake Pale, gray, or blue-colored skin, lips, or nail beds, depending on skin tone *This list is not all possible symptoms. Please call your medical provider for any other symptoms that are severe or concerning to you. Call  911 or call ahead to your local emergency facility: Notify the operator that you are seeking care for someone who has or may have COVID-19. Call ahead before visiting your doctor Call ahead. Many medical visits for routine care are being postponed or done by phone or telemedicine. If you have a medical appointment that cannot be postponed, call your doctor's office, and tell them you have or may have COVID-19. This will help the office protect themselves and other patients. If you are sick, wear a well-fitting mask You should wear a mask if you must be around other people or animals, including pets (even at home). Wear a mask with the best fit, protection, and comfort for you. You don't need to wear the mask if you are alone. If you can't put on a mask (because of trouble breathing, for  example), cover your coughs and sneezes in some other way. Try to stay at least 6 feet away from other people. This will help protect the people around you. Masks should not be placed on young children under age 35 years, anyone who has trouble breathing, or anyone who is not able to remove the mask without help. Cover your coughs and sneezes Cover your mouth and nose with a tissue when you cough or sneeze. Throw away used tissues in a lined trash can. Immediately wash your hands with soap and water for at least 20 seconds. If soap and water are not available, clean your hands with an alcohol-based hand sanitizer that contains at least 60% alcohol. Clean your hands often Wash your hands often with soap and water for at least 20 seconds. This is especially important after blowing your nose, coughing, or sneezing; going to the bathroom; and before eating or preparing food. Use hand sanitizer if soap and water are not available. Use an alcohol-based hand sanitizer with at least 60% alcohol, covering all surfaces of your hands and rubbing them together until they feel dry. Soap and water are the best option, especially if hands are visibly dirty. Avoid touching your eyes, nose, and mouth with unwashed hands. Handwashing Tips Avoid sharing personal household items Do not share dishes, drinking glasses, cups, eating utensils, towels, or bedding with other people in your home. Wash these items thoroughly after using them with soap and water or put in the dishwasher. Clean surfaces in your home regularly Clean and disinfect high-touch surfaces (for example, doorknobs, tables, handles, light switches, and countertops) in your "sick room" and bathroom. In shared spaces, you should clean and disinfect surfaces and items after each use by the person who is ill. If you are sick and cannot clean, a caregiver or other person should only clean and disinfect the area around you (such as your bedroom and bathroom) on  an as needed basis. Your caregiver/other person should wait as long as possible (at least several hours) and wear a mask before entering, cleaning, and disinfecting shared spaces that you use. Clean and disinfect areas that may have blood, stool, or body fluids on them. Use household cleaners and disinfectants. Clean visible dirty surfaces with household cleaners containing soap or detergent. Then, use a household disinfectant. Use a product from [employer] List N: Disinfectants for Coronavirus (QQIWL-79). Be sure to follow the instructions on the label to ensure safe and effective use of the product. Many products recommend keeping the surface wet with a disinfectant for a certain period of time (look at "contact time" on the product label). You may also need to wear personal  protective equipment, such as gloves, depending on the directions on the product label. Immediately after disinfecting, wash your hands with soap and water for 20 seconds. For completed guidance on cleaning and disinfecting your home, visit Complete Disinfection Guidance. Take steps to improve ventilation at home Improve ventilation (air flow) at home to help prevent from spreading COVID-19 to other people in your household. Clear out COVID-19 virus particles in the air by opening windows, using air filters, and turning on fans in your home. Use this interactive tool to learn how to improve air flow in your home. When you can be around others after being sick with COVID-19 Deciding when you can be around others is different for different situations. Find out when you can safely end home isolation. For any additional questions about your care, contact your healthcare provider or state or local health department. 12/22/2020 Content source: [facility] for Immunization and Respiratory Diseases ([facility]), Division of Viral Diseases This information is not intended to replace advice given to you by your health care provider. Make sure  you discuss any questions you have with your health care provider. Document Revised: 02/04/2021 Document Reviewed: 02/04/2021 Elsevier Patient Education  2022 [employer].      If you have been instructed to have an in-person evaluation today at a local Urgent Care facility, please use the link below. It will take you to a list of all of our available [facility], including address, phone number and hours of operation. Please do not delay care.  [facility]  If you or a family member do not have a primary care provider, use the link below to schedule a visit and establish care. When you choose a [facility] primary care physician or advanced practice provider, you gain a long-term partner in health. Find a Primary Care Provider  Learn more about [facility] in-office and virtual care options: [facility]

## 2021-09-10 NOTE — Progress Notes (Signed)
Virtual Visit Consent   Jessica Martin, you are scheduled for a virtual visit with a [facility] provider today.     Just as with appointments in the office, your consent must be obtained to participate.  Your consent will be active for this visit and any virtual visit you may have with one of our providers in the next 365 days.     If you have a MyChart account, a copy of this consent can be sent to you electronically.  All virtual visits are billed to your insurance company just like a traditional visit in the office.    As this is a virtual visit, video technology does not allow for your provider to perform a traditional examination.  This may limit your provider's ability to fully assess your condition.  If your provider identifies any concerns that need to be evaluated in person or the need to arrange testing (such as labs, EKG, etc.), we will make arrangements to do so.     Although advances in technology are sophisticated, we cannot ensure that it will always work on either your end or our end.  If the connection with a video visit is poor, the visit may have to be switched to a telephone visit.  With either a video or telephone visit, we are not always able to ensure that we have a secure connection.     I need to obtain your verbal consent now.   Are you willing to proceed with your visit today?    Jessica Martin has provided verbal consent on 09/10/2021 for a virtual visit (video or telephone).   Leeanne Rio, [place]   Date: 09/10/2021 11:43 AM   Virtual Visit via Video Note   I, Leeanne Rio, connected with  Jessica Martin  ([id number], Jan 14, 1956) on 09/10/21 at 11:30 AM EST by a video-enabled telemedicine application and verified that I am speaking with the correct person using two identifiers.  Location: Patient: Virtual Visit Location Patient: Home Provider: Virtual Visit Location Provider: Home Office   I discussed the limitations of evaluation and management by  telemedicine and the availability of in person appointments. The patient expressed understanding and agreed to proceed.    History of Present Illness: Jessica Martin is a 65 y.o. who identifies as a female who was assigned female at birth, and is being seen today for COVID-19. Woke up this AM with chills and fatigue. Now with body aches, post-nasal drainage. Temperature this AM 100.8.  Denies chest pain or SOB. Has been ambulating without issue. She tested positive for COVID this morning.  Fiance diagnosed with COVID last week.   HPI: HPI  Problems:  Patient Active Problem List   Diagnosis Date Noted   Elevated lipids 07/03/2020   Hx of adenomatous colonic polyps 03/29/2017    Allergies: No Known Allergies Medications:  Current Outpatient Medications:    benzonatate (TESSALON) 100 MG capsule, Take 1 capsule (100 mg total) by mouth 3 (three) times daily as needed for cough., Disp: 30 capsule, Rfl: 0   nirmatrelvir/ritonavir EUA (PAXLOVID) 20 x 150 MG & 10 x 100MG  TABS, Take 3 tablets by mouth 2 (two) times daily for 5 days. (Take nirmatrelvir 150 mg two tablets twice daily for 5 days and ritonavir 100 mg one tablet twice daily for 5 days) Patient GFR is 63, Disp: 30 tablet, Rfl: 0   ALPRAZolam (XANAX) 0.5 MG tablet, Take 1 tablet (0.5 mg total) by mouth 2 (two) times  daily as needed., Disp: 60 tablet, Rfl: 0   COVID-19 mRNA bivalent vaccine, Pfizer, (PFIZER COVID-19 VAC BIVALENT) injection, Inject into the muscle., Disp: 0.3 mL, Rfl: 0   COVID-19 mRNA Vac-TriS, Pfizer, SUSP injection, Inject into the muscle., Disp: 0.3 mL, Rfl: 0   HYDROcodone-acetaminophen (NORCO/VICODIN) 5-325 MG tablet, Take 1 tablet by mouth every 4 (four) hours as needed. for pain, Disp: 20 tablet, Rfl: 0   rosuvastatin (CRESTOR) 40 MG tablet, Take 40 mg by mouth daily., Disp: , Rfl:    Vilazodone HCl 20 MG TABS, Take by mouth., Disp: , Rfl:   Observations/Objective: Patient is well-developed, well-nourished in no acute  distress.  Resting comfortably at home.  Head is normocephalic, atraumatic.  No labored breathing. Speech is clear and coherent with logical content.  Patient is alert and oriented at baseline.   Assessment and Plan: 1. COVID-19 - nirmatrelvir/ritonavir EUA (PAXLOVID) 20 x 150 MG & 10 x 100MG  TABS; Take 3 tablets by mouth 2 (two) times daily for 5 days. (Take nirmatrelvir 150 mg two tablets twice daily for 5 days and ritonavir 100 mg one tablet twice daily for 5 days) Patient GFR is 63  Dispense: 30 tablet; Refill: 0 - benzonatate (TESSALON) 100 MG capsule; Take 1 capsule (100 mg total) by mouth 3 (three) times daily as needed for cough.  Dispense: 30 capsule; Refill: 0 - MyChart COVID-19 home monitoring program; Future Patient with multiple risk factors for complicated course of illness. Discussed risks/benefits of antiviral medications including most common potential ADRs. Patient voiced understanding and would like to proceed with antiviral medication. They are candidate for paxlovid as recent creatinine (0.99) and GFR in normal range. She wants to proceed with this over molnupiravir. 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 Rx sent to pharmacy. Supportive measures, OTC medications and vitamin regimen reviewed. Tessalon per orders. Patient has been enrolled in a MyChart COVID symptom monitoring program. Samule Dry reviewed in detail. Strict ER precautions discussed with patient.    Follow Up Instructions: I discussed the assessment and treatment plan with the patient. The patient was provided an opportunity to ask questions and all were answered. The patient agreed with the plan and demonstrated an understanding of the instructions.  A copy of instructions were sent to the patient via MyChart unless otherwise noted below.   The patient was advised to call back or seek an in-person evaluation if the  symptoms worsen or if the condition fails to improve as anticipated.  Time:  I spent 12 minutes with the patient via telehealth technology discussing the above problems/concerns.    Leeanne Rio, PA-C

## 2021-12-01 DIAGNOSIS — H40053 Ocular hypertension, bilateral: Secondary | ICD-10-CM | POA: Diagnosis not present

## 2021-12-01 DIAGNOSIS — H2513 Age-related nuclear cataract, bilateral: Secondary | ICD-10-CM | POA: Diagnosis not present

## 2022-02-10 ENCOUNTER — Encounter (HOSPITAL_BASED_OUTPATIENT_CLINIC_OR_DEPARTMENT_OTHER): Payer: Self-pay | Admitting: *Deleted

## 2022-04-08 ENCOUNTER — Other Ambulatory Visit: Payer: Self-pay | Admitting: Obstetrics & Gynecology

## 2022-04-08 DIAGNOSIS — Z1231 Encounter for screening mammogram for malignant neoplasm of breast: Secondary | ICD-10-CM

## 2022-04-12 ENCOUNTER — Other Ambulatory Visit (HOSPITAL_BASED_OUTPATIENT_CLINIC_OR_DEPARTMENT_OTHER): Payer: Self-pay

## 2022-04-27 ENCOUNTER — Ambulatory Visit: Payer: 59

## 2022-05-04 ENCOUNTER — Ambulatory Visit
Admission: RE | Admit: 2022-05-04 | Discharge: 2022-05-04 | Disposition: A | Discharge status: Home / Self Care | Payer: 59 | Source: Ambulatory Visit | Attending: Obstetrics & Gynecology | Admitting: Obstetrics & Gynecology

## 2022-05-04 DIAGNOSIS — Z1231 Encounter for screening mammogram for malignant neoplasm of breast: Secondary | ICD-10-CM | POA: Diagnosis not present

## 2022-08-18 DIAGNOSIS — L820 Inflamed seborrheic keratosis: Secondary | ICD-10-CM | POA: Diagnosis not present

## 2022-08-18 DIAGNOSIS — D692 Other nonthrombocytopenic purpura: Secondary | ICD-10-CM | POA: Diagnosis not present

## 2022-08-18 DIAGNOSIS — L905 Scar conditions and fibrosis of skin: Secondary | ICD-10-CM | POA: Diagnosis not present

## 2022-08-18 DIAGNOSIS — D225 Melanocytic nevi of trunk: Secondary | ICD-10-CM | POA: Diagnosis not present

## 2022-08-18 DIAGNOSIS — D1801 Hemangioma of skin and subcutaneous tissue: Secondary | ICD-10-CM | POA: Diagnosis not present

## 2022-08-18 DIAGNOSIS — L821 Other seborrheic keratosis: Secondary | ICD-10-CM | POA: Diagnosis not present

## 2022-08-18 DIAGNOSIS — Z85820 Personal history of malignant melanoma of skin: Secondary | ICD-10-CM | POA: Diagnosis not present

## 2022-08-18 DIAGNOSIS — D2272 Melanocytic nevi of left lower limb, including hip: Secondary | ICD-10-CM | POA: Diagnosis not present

## 2022-08-18 DIAGNOSIS — L738 Other specified follicular disorders: Secondary | ICD-10-CM | POA: Diagnosis not present

## 2022-08-24 ENCOUNTER — Other Ambulatory Visit (HOSPITAL_BASED_OUTPATIENT_CLINIC_OR_DEPARTMENT_OTHER): Payer: Self-pay

## 2022-08-24 MED ORDER — COMIRNATY 30 MCG/0.3ML IM SUSY
PREFILLED_SYRINGE | INTRAMUSCULAR | 0 refills | Status: DC
  Filled 2022-08-24: qty 0.3, 1d supply, fill #0

## 2022-08-31 ENCOUNTER — Encounter: Payer: Self-pay | Admitting: Obstetrics & Gynecology

## 2022-09-01 ENCOUNTER — Other Ambulatory Visit (HOSPITAL_BASED_OUTPATIENT_CLINIC_OR_DEPARTMENT_OTHER): Payer: Self-pay

## 2022-09-01 ENCOUNTER — Encounter (HOSPITAL_BASED_OUTPATIENT_CLINIC_OR_DEPARTMENT_OTHER): Payer: Self-pay | Admitting: Obstetrics & Gynecology

## 2022-09-01 ENCOUNTER — Ambulatory Visit (INDEPENDENT_AMBULATORY_CARE_PROVIDER_SITE_OTHER): Payer: 59 | Admitting: Obstetrics & Gynecology

## 2022-09-01 VITALS — BP 137/76 | HR 77 | Ht 65.5 in | Wt 164.8 lb

## 2022-09-01 DIAGNOSIS — G43909 Migraine, unspecified, not intractable, without status migrainosus: Secondary | ICD-10-CM

## 2022-09-01 DIAGNOSIS — Z13820 Encounter for screening for osteoporosis: Secondary | ICD-10-CM | POA: Diagnosis not present

## 2022-09-01 DIAGNOSIS — Z01419 Encounter for gynecological examination (general) (routine) without abnormal findings: Secondary | ICD-10-CM | POA: Diagnosis not present

## 2022-09-01 DIAGNOSIS — F419 Anxiety disorder, unspecified: Secondary | ICD-10-CM | POA: Diagnosis not present

## 2022-09-01 MED ORDER — ALPRAZOLAM 0.5 MG PO TABS: 0.5000 mg | ORAL_TABLET | Freq: Two times a day (BID) | ORAL | 0 refills | Status: DC | PRN

## 2022-09-01 MED ORDER — HYDROCODONE-ACETAMINOPHEN 5-325 MG PO TABS: 1.0000 | ORAL_TABLET | ORAL | 0 refills | Status: DC | PRN

## 2022-09-01 MED ORDER — PREVNAR 20 0.5 ML IM SUSY
PREFILLED_SYRINGE | INTRAMUSCULAR | 0 refills | Status: DC
  Filled 2022-09-01: qty 0.5, 1d supply, fill #0

## 2022-09-01 NOTE — Progress Notes (Signed)
66 y.o. G0P0000 Single White or Caucasian female here for AEX.  Doing well.  Denies vaginal bleeding.  Is on crestor.  Had lab work done at [facility] day.  Does receive small prescription of xanax and vicodin.  Never asks for refills.  She does request vit D level being done today.  Vaccines are up to date:  no.  Reviewed with pt today. Colonoscopy:  03/22/2017 MMG:  05/04/2022 Negative BMD: order placed Last pap smear:  03/19/2018 Negative.   H/o abnormal pap smear:  no   reports that she has never smoked. She has never used smokeless tobacco. She reports current alcohol use. She reports that she does not use drugs.  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 0.3 mL 0   COVID-19 mRNA Vac-TriS, Pfizer, SUSP injection Inject into the muscle. 0.3 mL 0   COVID-19 mRNA vaccine 2023-2024 (COMIRNATY) syringe Inject into the muscle. 0.3 mL 0   rosuvastatin (CRESTOR) 40 MG tablet Take 40 mg by mouth daily.     Vilazodone HCl 20 MG TABS Take by mouth.     ALPRAZolam (XANAX) 0.5 MG tablet Take 1 tablet (0.5 mg total) by mouth 2 (two) times daily as needed. 60 tablet 0   benzonatate (TESSALON) 100 MG capsule Take 1 capsule (100 mg total) by mouth 3 (three) times daily as needed for cough. 30 capsule 0   HYDROcodone-acetaminophen (NORCO/VICODIN) 5-325 MG tablet Take 1 tablet by mouth every 4 (four) hours as needed. for pain 20 tablet 0   No current facility-administered medications for this visit.    Family History   Problem Relation Age of Onset   Lung cancer Mother    Colon cancer Other 26   Colon cancer Brother    Esophageal cancer Neg Hx    Rectal cancer Neg Hx    Stomach cancer Neg Hx     Review of Systems  Constitutional: Negative.   Genitourinary: Negative.     Exam:   BP 137/76 (BP Location: Left Arm, Patient Position: Sitting, Cuff Size: Large)   Pulse 77   Ht 5' 5.5" (1.664 m)   Wt 164 lb 12.8 oz (74.8 kg)   LMP 10/03/2001 (Approximate)   BMI 27.01 kg/m   Height: 5' 5.5" (166.4 cm)  General appearance: alert, cooperative and appears stated age Breasts: normal appearance, no masses or tenderness Abdomen: soft, non-tender; bowel sounds normal; no masses,  no organomegaly Lymph nodes: Cervical, supraclavicular, and axillary nodes normal.  No abnormal inguinal nodes palpated Neurologic: Grossly normal  Pelvic: External genitalia:  no lesions              Urethra:  normal appearing urethra with no masses, tenderness or lesions              Bartholins and Skenes: normal                 Vagina: normal appearing vagina with atrophic changes and no discharge, no lesions  Cervix: no lesions              Pap taken: No. Bimanual Exam:  Uterus:  normal size, contour, position, consistency, mobility, non-tender              Adnexa: normal adnexa and no mass, fullness, tenderness               Rectovaginal: Confirms               Anus:  normal sphincter tone, no lesions  Chaperone, Octaviano Batty, CMA, was present for exam.  Assessment/Plan: 1. Well woman exam with routine gynecological exam - Pap smear 03/19/2018.  No abnormal hx.  Will not plan future pap smears unless there is some change. - Mammogram 8/2//2023  - Colonoscopy 05/04/2022, negative - Bone mineral density order placed - lab work done on doctor's day - vaccines reviewed/updated  2. Migraine without status migrainosus, not intractable, unspecified migraine type  - HYDROcodone-acetaminophen (NORCO/VICODIN)  5-325 MG tablet; Take 1 tablet by mouth every 4 (four) hours as needed. for pain  Dispense: 20 tablet; Refill: 0  3. Anxiety - ALPRAZolam (XANAX) 0.5 MG tablet; Take 1 tablet (0.5 mg total) by mouth 2 (two) times daily as needed.  Dispense: 60 tablet; Refill: 0  4. Osteoporosis screening - DG BONE DENSITY (DXA); Future - VITAMIN D 25 Hydroxy (Vit-D Deficiency, Fractures)

## 2022-09-02 LAB — VITAMIN D 25 HYDROXY (VIT D DEFICIENCY, FRACTURES): Vit D, 25-Hydroxy: 28.4 ng/mL — ABNORMAL LOW (ref 30.0–100.0)

## 2022-11-02 ENCOUNTER — Encounter (HOSPITAL_BASED_OUTPATIENT_CLINIC_OR_DEPARTMENT_OTHER): Payer: Self-pay | Admitting: Obstetrics & Gynecology

## 2022-11-04 ENCOUNTER — Other Ambulatory Visit (HOSPITAL_BASED_OUTPATIENT_CLINIC_OR_DEPARTMENT_OTHER): Payer: Self-pay | Admitting: Obstetrics & Gynecology

## 2022-11-04 DIAGNOSIS — F419 Anxiety disorder, unspecified: Secondary | ICD-10-CM

## 2022-11-04 MED ORDER — ALPRAZOLAM 0.5 MG PO TABS: 0.5000 mg | ORAL_TABLET | Freq: Two times a day (BID) | ORAL | 0 refills | Status: DC | PRN

## 2022-11-04 MED ORDER — VILAZODONE HCL 20 MG PO TABS: 20.0000 mg | ORAL_TABLET | Freq: Every day | ORAL | 12 refills | Status: DC

## 2022-11-21 ENCOUNTER — Encounter: Payer: Self-pay | Admitting: *Deleted

## 2022-12-14 DIAGNOSIS — H5203 Hypermetropia, bilateral: Secondary | ICD-10-CM | POA: Diagnosis not present

## 2023-02-27 ENCOUNTER — Other Ambulatory Visit (HOSPITAL_BASED_OUTPATIENT_CLINIC_OR_DEPARTMENT_OTHER): Payer: Self-pay | Admitting: Obstetrics & Gynecology

## 2023-02-27 DIAGNOSIS — G43909 Migraine, unspecified, not intractable, without status migrainosus: Secondary | ICD-10-CM

## 2023-03-14 IMAGING — CT CT CARDIAC CORONARY ARTERY CALCIUM SCORE
3 series · 14 of 20 positions shown, 15 images · non-contrast
Comparison: 09/07/2012 chest radiograph.  PET of 02/16/2005.

Addendum:
CLINICAL DATA: Cardiovascular disease risk stratification

EXAM:
CT Coronary Calcium Score
TECHNIQUE: A gated, non-contrast computed tomography scan of the heart was
performed using 3mm slice thickness. Axial images were analyzed on a
dedicated workstation. Calcium scoring of the coronary arteries was
performed using the Agatston method.

[Series 3: 2 hrt calcium · axial · 0.39mm/px · z∈[-164,-89]mm · 4 of 43 slices shown, 5 images]
[im 9/43  vessel]
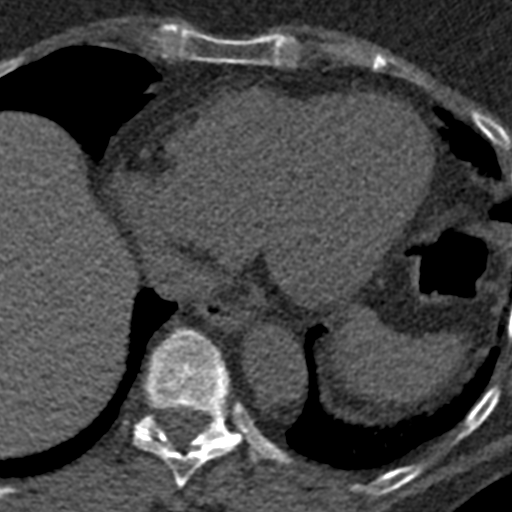
[im 9/43  lung]
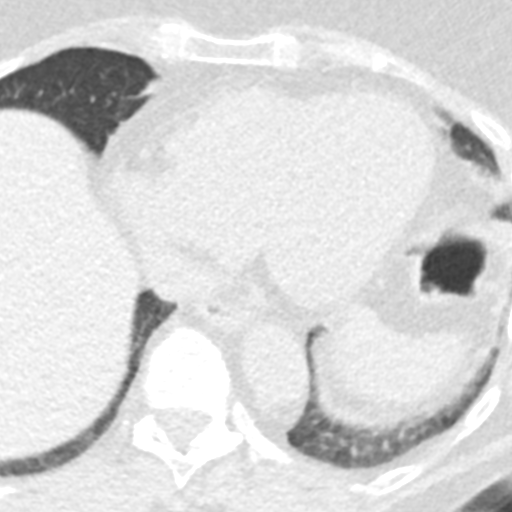
[im 17/43  vessel]
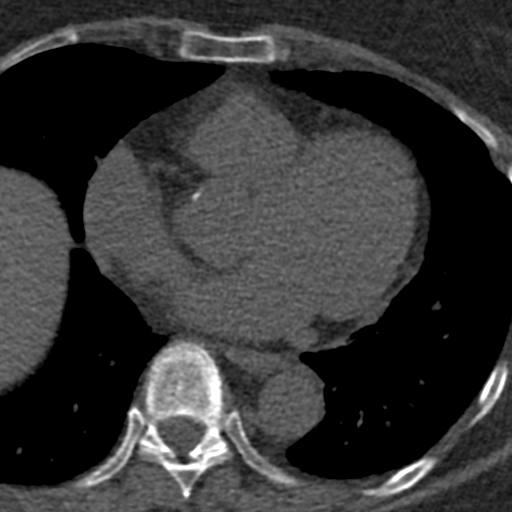
[im 26/43  vessel]
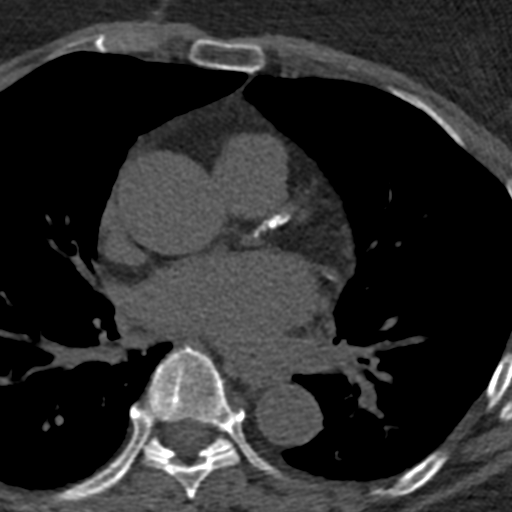
[im 34/43  vessel]
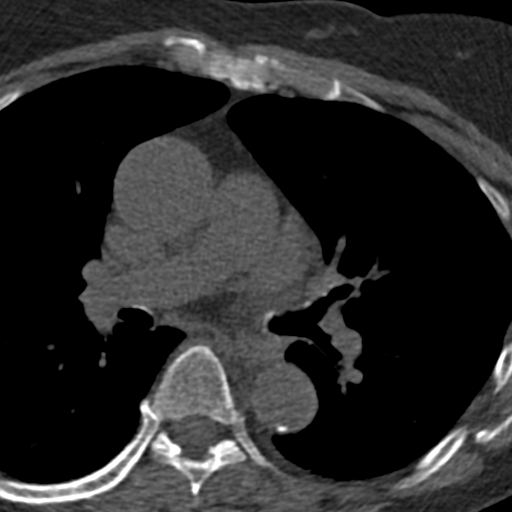

[Series 4: 2 soft full fov 71 % · axial · 0.66mm/px · z∈[-176,-86]mm · 5 of 46 slices shown]
[im 8/46  vessel]
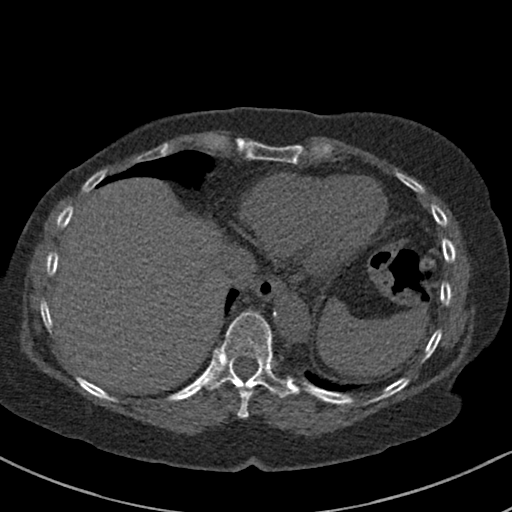
[im 16/46  vessel]
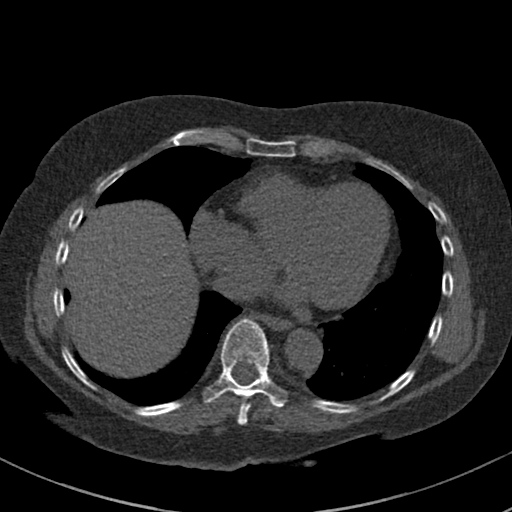
[im 23/46  vessel]
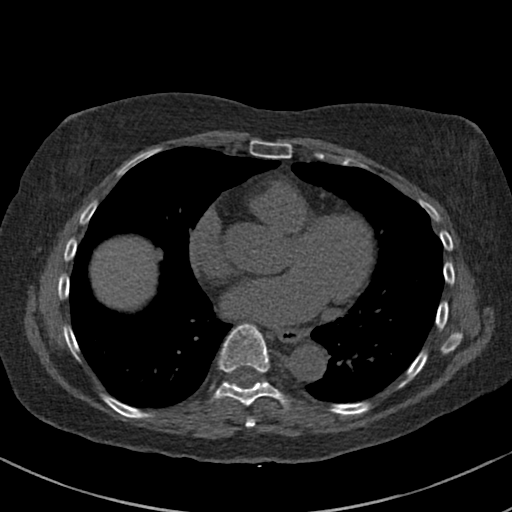
[im 31/46  vessel]
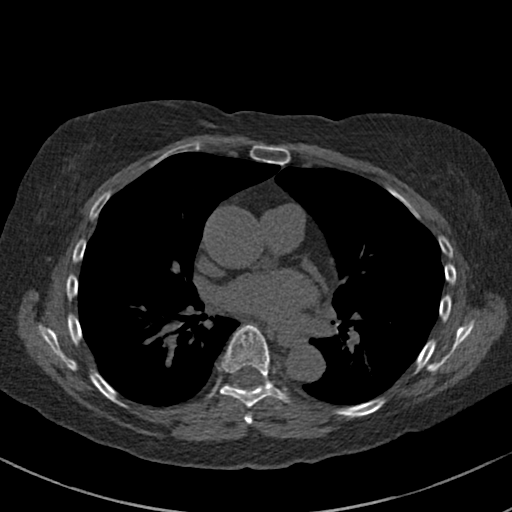
[im 38/46  vessel]
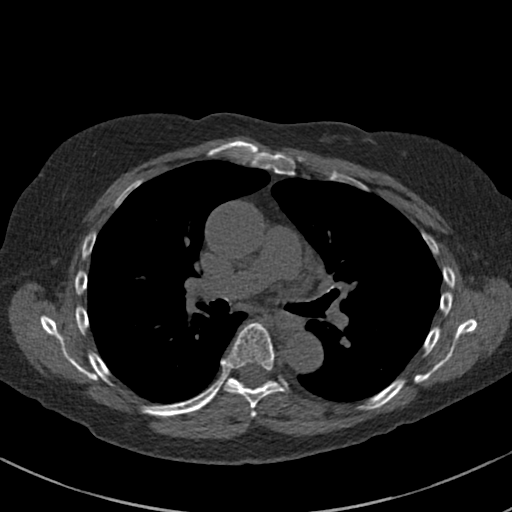

[Series 6: 2 lungs 71 % · axial · 0.66mm/px · z∈[-176,-86]mm · 5 of 46 slices shown]
[im 8/46  vessel]
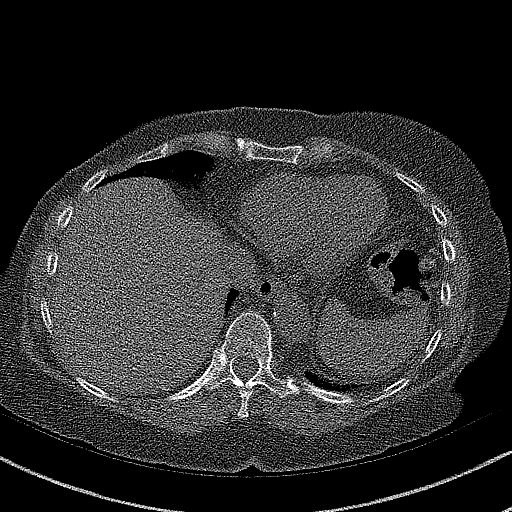
[im 16/46  vessel]
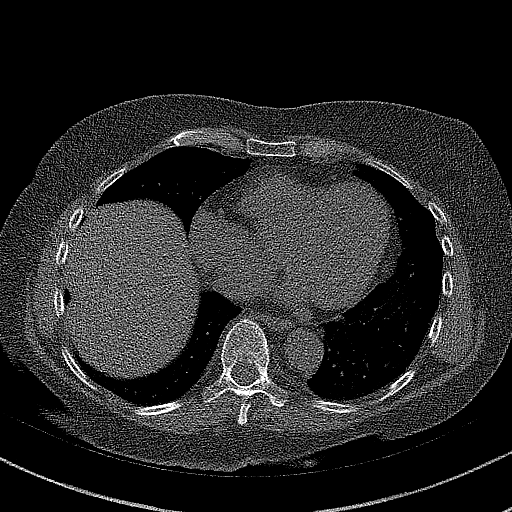
[im 23/46  vessel]
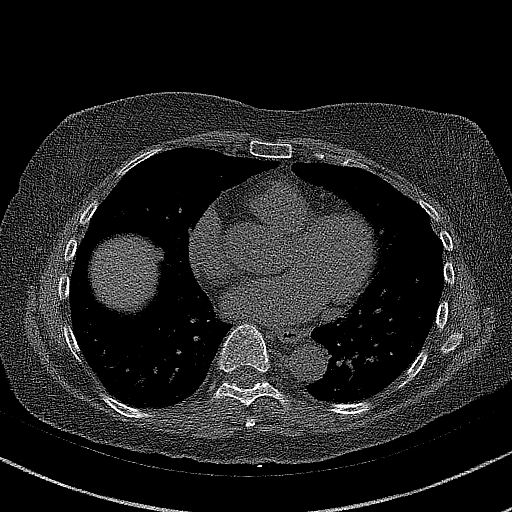
[im 31/46  vessel]
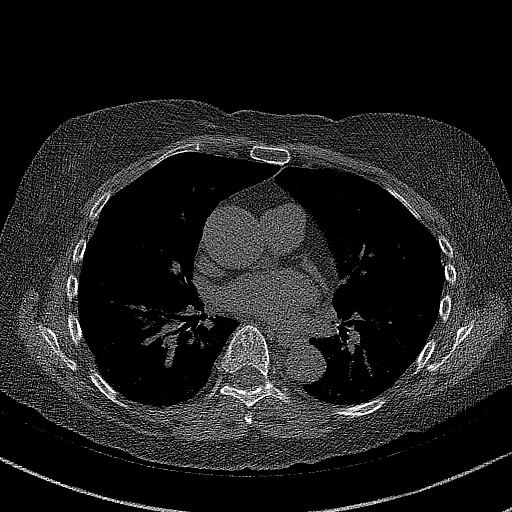
[im 38/46  vessel]
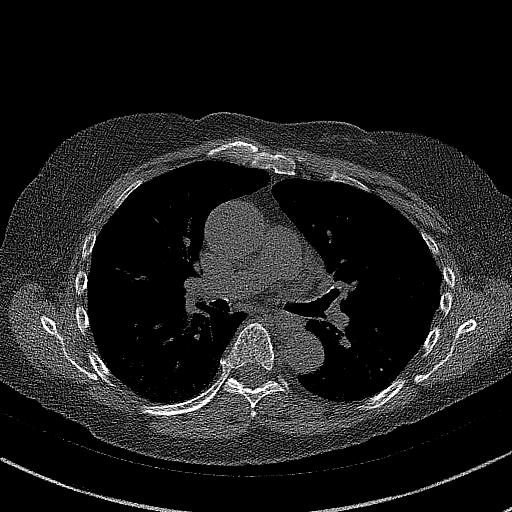

[14 of 20 positions shown; findings below may reference images not displayed]

FINDINGS: Coronary arteries: Normal origins.

Coronary Calcium Score:

Left main: 0

Left anterior descending artery: 312, proximal and mid vessel

Left circumflex artery: 8, mid vessel

Right coronary artery: 46 proximal vessel

Total: 366

Percentile: 93rd

Pericardium: Normal.

Ascending Aorta: Normal caliber. Ascending aorta measures 37mm at
the mid ascending aorta measured in an axial plane.

Non-cardiac: See separate report from [REDACTED].
IMPRESSION: Coronary calcium score of 366. This was 93rd percentile for age-,
race-, and sex-matched controls.



If CAC=0, it is reasonable to withhold statin therapy and reassess
in 5 to 10 years, as long as higher risk conditions are absent
(diabetes mellitus, family history of premature CHD in first degree
relatives (males <55 years; females <65 years), cigarette smoking,
or LDL >=190 mg/dL).

If CAC is 1 to 99, it is reasonable to initiate statin therapy for
patients >=55 years of age.

If CAC is >=100 or >=75th percentile, it is reasonable to initiate
statin therapy at any age.

Cardiology referral should be considered for patients with CAC
scores >=400 or >=75th percentile.

*9028 AHA/ACC/AACVPR/AAPA/ABC/KALAMKAS/EVANGELIST/DINESH KUMAR/Fransisco/JONATHN/YINUO/YHERY
Guideline on the Management of Blood Cholesterol: A Report of the
American College of Cardiology/American Heart Association Task Force
on Clinical Practice Guidelines. J Am Coll Cardiol.
9987;73(24):4587-4704.

EXAM:
OVER-READ INTERPRETATION  CT CHEST

The following report is an over-read performed by radiologist Tomatino.
Normita Reichert [REDACTED] on 02/09/2021. This over-read
does not include interpretation of cardiac or coronary anatomy or
pathology. The calcium score interpretation by the cardiologist is
attached.
FINDINGS: Vascular: Aortic atherosclerosis.  Tortuous thoracic aorta.

Mediastinum/Nodes: No imaged thoracic adenopathy.

Lungs/Pleura: No pleural fluid.  Clear imaged lungs.

Upper Abdomen: Normal imaged portions of the liver, spleen, stomach.

Musculoskeletal: No acute osseous abnormality. Mild right
hemidiaphragm elevation with convex right thoracic spine curvature.
IMPRESSION: 1.  No acute findings in the imaged extracardiac chest.
2.  Aortic Atherosclerosis ([id number]).

*** End of Addendum ***
FINDINGS: Coronary arteries: Normal origins.

Coronary Calcium Score:

Left main: 0

Left anterior descending artery: 312, proximal and mid vessel

Left circumflex artery: 8, mid vessel

Right coronary artery: 46 proximal vessel

Total: 366

Percentile: 93rd

Pericardium: Normal.

Ascending Aorta: Normal caliber. Ascending aorta measures 37mm at
the mid ascending aorta measured in an axial plane.

Non-cardiac: See separate report from [REDACTED].
IMPRESSION: Coronary calcium score of 366. This was 93rd percentile for age-,
race-, and sex-matched controls.



If CAC=0, it is reasonable to withhold statin therapy and reassess
in 5 to 10 years, as long as higher risk conditions are absent
(diabetes mellitus, family history of premature CHD in first degree
relatives (males <55 years; females <65 years), cigarette smoking,
or LDL >=190 mg/dL).

If CAC is 1 to 99, it is reasonable to initiate statin therapy for
patients >=55 years of age.

If CAC is >=100 or >=75th percentile, it is reasonable to initiate
statin therapy at any age.

Cardiology referral should be considered for patients with CAC
scores >=400 or >=75th percentile.

*9028 AHA/ACC/AACVPR/AAPA/ABC/KALAMKAS/EVANGELIST/DINESH KUMAR/Fransisco/JONATHN/YINUO/YHERY
Guideline on the Management of Blood Cholesterol: A Report of the
American College of Cardiology/American Heart Association Task Force
on Clinical Practice Guidelines. J Am Coll Cardiol.
9987;73(24):4587-4704.

## 2023-07-13 ENCOUNTER — Other Ambulatory Visit: Payer: Self-pay | Admitting: Obstetrics & Gynecology

## 2023-07-13 DIAGNOSIS — Z1231 Encounter for screening mammogram for malignant neoplasm of breast: Secondary | ICD-10-CM

## 2023-07-26 ENCOUNTER — Ambulatory Visit
Admission: RE | Admit: 2023-07-26 | Discharge: 2023-07-26 | Disposition: A | Discharge status: Home / Self Care | Payer: Commercial Managed Care - PPO | Source: Ambulatory Visit | Attending: Obstetrics & Gynecology | Admitting: Obstetrics & Gynecology

## 2023-07-26 DIAGNOSIS — Z1231 Encounter for screening mammogram for malignant neoplasm of breast: Secondary | ICD-10-CM | POA: Diagnosis not present

## 2023-08-22 DIAGNOSIS — D1801 Hemangioma of skin and subcutaneous tissue: Secondary | ICD-10-CM | POA: Diagnosis not present

## 2023-08-22 DIAGNOSIS — L570 Actinic keratosis: Secondary | ICD-10-CM | POA: Diagnosis not present

## 2023-08-22 DIAGNOSIS — L821 Other seborrheic keratosis: Secondary | ICD-10-CM | POA: Diagnosis not present

## 2023-08-22 DIAGNOSIS — Z85820 Personal history of malignant melanoma of skin: Secondary | ICD-10-CM | POA: Diagnosis not present

## 2023-08-22 DIAGNOSIS — B353 Tinea pedis: Secondary | ICD-10-CM | POA: Diagnosis not present

## 2023-08-22 DIAGNOSIS — D225 Melanocytic nevi of trunk: Secondary | ICD-10-CM | POA: Diagnosis not present

## 2023-08-22 DIAGNOSIS — L738 Other specified follicular disorders: Secondary | ICD-10-CM | POA: Diagnosis not present

## 2023-08-30 ENCOUNTER — Encounter: Payer: Self-pay | Admitting: Internal Medicine

## 2023-08-30 ENCOUNTER — Ambulatory Visit: Payer: Commercial Managed Care - PPO | Admitting: Internal Medicine

## 2023-08-30 ENCOUNTER — Ambulatory Visit (INDEPENDENT_AMBULATORY_CARE_PROVIDER_SITE_OTHER): Payer: Commercial Managed Care - PPO

## 2023-08-30 VITALS — BP 122/78 | HR 74 | Temp 98.1°F | Ht 65.5 in | Wt 153.0 lb

## 2023-08-30 DIAGNOSIS — R051 Acute cough: Secondary | ICD-10-CM

## 2023-08-30 DIAGNOSIS — I2583 Coronary atherosclerosis due to lipid rich plaque: Secondary | ICD-10-CM | POA: Diagnosis not present

## 2023-08-30 DIAGNOSIS — R918 Other nonspecific abnormal finding of lung field: Secondary | ICD-10-CM | POA: Diagnosis not present

## 2023-08-30 DIAGNOSIS — J069 Acute upper respiratory infection, unspecified: Secondary | ICD-10-CM

## 2023-08-30 DIAGNOSIS — R059 Cough, unspecified: Secondary | ICD-10-CM | POA: Diagnosis not present

## 2023-08-30 DIAGNOSIS — Z860101 Personal history of adenomatous and serrated colon polyps: Secondary | ICD-10-CM | POA: Diagnosis not present

## 2023-08-30 DIAGNOSIS — I2510 Atherosclerotic heart disease of native coronary artery without angina pectoris: Secondary | ICD-10-CM | POA: Insufficient documentation

## 2023-08-30 MED ORDER — HYDROCODONE BIT-HOMATROP MBR 5-1.5 MG/5ML PO SOLN: 5.0000 mL | Freq: Three times a day (TID) | ORAL | 0 refills | Status: DC | PRN

## 2023-08-30 MED ORDER — METHYLPREDNISOLONE 4 MG PO TBPK: ORAL_TABLET | ORAL | 0 refills | Status: DC

## 2023-08-30 MED ORDER — AMOXICILLIN-POT CLAVULANATE 875-125 MG PO TABS: 1.0000 | ORAL_TABLET | Freq: Two times a day (BID) | ORAL | 0 refills | Status: DC

## 2023-08-30 MED ORDER — AIRSUPRA 90-80 MCG/ACT IN AERO: 2.0000 | INHALATION_SPRAY | RESPIRATORY_TRACT | 5 refills | Status: DC | PRN

## 2023-08-30 NOTE — Assessment & Plan Note (Addendum)
Coronary calcium score of 366 - 2022 On Crestor

## 2023-08-30 NOTE — Progress Notes (Signed)
Subjective:  Patient ID: Jessica Martin, female    DOB: Apr 17, 1956  Age: 67 y.o. MRN: [id number]  CC: Cough (Pt states she has had a cough x3-4 weeks now... No wheezing, chills, sweats or fever. Pt has been taking prednisone and Lamisil)   HPI Jessica Martin presents for productive cough x 4 wks C/o cough, congestion, fatigue.  Not sleeping well. She started to take Levaquin and prednisone with some improvement.  She is a new patient to this practice. Her chest feels tight.   Outpatient Medications Prior to Visit  Medication Sig Dispense Refill   ALPRAZolam (XANAX) 0.5 MG tablet Take 1 tablet (0.5 mg total) by mouth 2 (two) times daily as needed. 60 tablet 0   rosuvastatin (CRESTOR) 40 MG tablet Take 40 mg by mouth daily.     terbinafine (LAMISIL) 250 MG tablet Take 250 mg by mouth daily.     Vilazodone HCl 20 MG TABS Take 1 tablet (20 mg total) by mouth daily. 30 tablet 12   COVID-19 mRNA bivalent vaccine, Pfizer, (PFIZER COVID-19 VAC BIVALENT) injection Inject into the muscle. 0.3 mL 0   COVID-19 mRNA Vac-TriS, Pfizer, SUSP injection Inject into the muscle. 0.3 mL 0   COVID-19 mRNA vaccine 2023-2024 (COMIRNATY) syringe Inject into the muscle. 0.3 mL 0   HYDROcodone-acetaminophen (NORCO/VICODIN) 5-325 MG tablet Take 1 tablet by mouth every 4 (four) hours as needed. for pain 20 tablet 0   pneumococcal 20-valent conjugate vaccine (PREVNAR 20) 0.5 ML injection Inject into the muscle. 0.5 mL 0   No facility-administered medications prior to visit.    ROS: Review of Systems  Constitutional:  Positive for fatigue. Negative for activity change, appetite change, chills and unexpected weight change.  HENT:  Positive for congestion. Negative for mouth sores and sinus pressure.   Eyes:  Negative for visual disturbance.  Respiratory:  Positive for cough and chest tightness. Negative for shortness of breath.   Gastrointestinal:  Negative for abdominal pain and nausea.  Genitourinary:  Negative  for difficulty urinating, frequency and vaginal pain.  Musculoskeletal:  Negative for back pain and gait problem.  Skin:  Negative for pallor and rash.  Neurological:  Negative for dizziness, tremors, weakness, numbness and headaches.  Psychiatric/Behavioral:  Negative for confusion and sleep disturbance.     Objective:  BP 122/78 (BP Location: Left Arm, Patient Position: Sitting, Cuff Size: Normal)   Pulse 74   Temp 98.1 F (36.7 C) (Oral)   Ht 5' 5.5" (1.664 m)   Wt 153 lb (69.4 kg)   LMP 10/03/2001 (Approximate)   SpO2 99%   BMI 25.07 kg/m   BP Readings from Last 3 Encounters:  08/30/23 122/78  09/01/22 137/76  07/05/21 (!) 145/90    Wt Readings from Last 3 Encounters:  08/30/23 153 lb (69.4 kg)  09/01/22 164 lb 12.8 oz (74.8 kg)  07/05/21 174 lb 12.8 oz (79.3 kg)    Physical Exam Constitutional:      General: She is not in acute distress.    Appearance: Normal appearance. She is well-developed. She is not toxic-appearing.  HENT:     Head: Normocephalic.     Right Ear: External ear normal.     Left Ear: External ear normal.     Nose: Nose normal.     Mouth/Throat:     Pharynx: Posterior oropharyngeal erythema present. No oropharyngeal exudate.  Eyes:     General:        Right eye: No discharge.  Left eye: No discharge.     Conjunctiva/sclera: Conjunctivae normal.     Pupils: Pupils are equal, round, and reactive to light.  Neck:     Thyroid: No thyromegaly.     Vascular: No JVD.     Trachea: No tracheal deviation.  Cardiovascular:     Rate and Rhythm: Normal rate and regular rhythm.     Heart sounds: Normal heart sounds.  Pulmonary:     Effort: No respiratory distress.     Breath sounds: No stridor. No wheezing.  Abdominal:     General: Bowel sounds are normal. There is no distension.     Palpations: Abdomen is soft. There is no mass.     Tenderness: There is no abdominal tenderness. There is no guarding or rebound.  Musculoskeletal:         General: No tenderness.     Cervical back: Normal range of motion and neck supple. No rigidity.  Lymphadenopathy:     Cervical: No cervical adenopathy.  Skin:    Findings: No erythema or rash.  Neurological:     Cranial Nerves: No cranial nerve deficit.     Motor: No abnormal muscle tone.     Coordination: Coordination normal.     Deep Tendon Reflexes: Reflexes normal.  Psychiatric:        Behavior: Behavior normal.        Thought Content: Thought content normal.        Judgment: Judgment normal.   Erythematous nasal mucosa Appears tired  Lab Results  Component Value Date   WBC 5.7 07/05/2019   HGB 14.0 07/05/2019   HCT 42.3 07/05/2019   PLT 304 07/05/2019   GLUCOSE 86 07/05/2019   CHOL 205 (H) 07/05/2019   TRIG 119 07/05/2019   HDL 86 07/05/2019   LDLCALC 97 07/05/2019   ALT 11 07/05/2019   AST 13 07/05/2019   NA 141 07/05/2019   K 4.5 07/05/2019   CL 106 07/05/2019   CREATININE 0.92 07/05/2019   BUN 12 07/05/2019   CO2 27 07/05/2019   TSH 0.43 07/05/2019    MM 3D SCREENING MAMMOGRAM BILATERAL BREAST  Result Date: 07/27/2023 CLINICAL DATA:  Screening. EXAM: DIGITAL SCREENING BILATERAL MAMMOGRAM WITH TOMOSYNTHESIS AND CAD TECHNIQUE: Bilateral screening digital craniocaudal and mediolateral oblique mammograms were obtained. Bilateral screening digital breast tomosynthesis was performed. The images were evaluated with computer-aided detection. COMPARISON:  Previous exam(s). ACR Breast Density Category b: There are scattered areas of fibroglandular density. FINDINGS: There are no findings suspicious for malignancy. IMPRESSION: No mammographic evidence of malignancy. A result letter of this screening mammogram will be mailed directly to the patient. RECOMMENDATION: Screening mammogram in one year. (Code:SM-B-01Y) BI-RADS CATEGORY  1: Negative. Electronically Signed   By: Frederico Hamman M.D.   On: 07/27/2023 16:42    Assessment & Plan:   Problem List Items Addressed  This Visit     Hx of adenomatous colonic polyps    Follow-up with Dr. Leone Payor - past due colonoscopy      Coronary atherosclerosis - Primary    Coronary calcium score of 366 - 2022 On Crestor      Upper respiratory tract infection    New.  Refractory symptoms.  Finish Levaquin 5 days.  Prednisone Dosepak.  Airsupra prescribed for bronchospasm/cough Hycodan as needed Augmentin if not better Obtain chest x-ray      Relevant Medications   terbinafine (LAMISIL) 250 MG tablet   Other Visit Diagnoses     Acute cough  Relevant Orders   DG Chest 2 View (Completed)          Meds ordered this encounter  Medications   HYDROcodone bit-homatropine (HYCODAN) 5-1.5 MG/5ML syrup    Sig: Take 5 mLs by mouth every 8 (eight) hours as needed for cough.    Dispense:  120 mL    Refill:  0   amoxicillin-clavulanate (AUGMENTIN) 875-125 MG tablet    Sig: Take 1 tablet by mouth 2 (two) times daily.    Dispense:  20 tablet    Refill:  0   methylPREDNISolone (MEDROL DOSEPAK) 4 MG TBPK tablet    Sig: As directed    Dispense:  21 tablet    Refill:  0   Albuterol-Budesonide (AIRSUPRA) 90-80 MCG/ACT AERO    Sig: Inhale 2 Inhalations into the lungs every 4 (four) hours as needed.    Dispense:  10 g    Refill:  5      Follow-up: Return for a follow-up visit.  Sonda Primes, MD

## 2023-09-03 ENCOUNTER — Encounter: Payer: Self-pay | Admitting: Internal Medicine

## 2023-09-03 DIAGNOSIS — J069 Acute upper respiratory infection, unspecified: Secondary | ICD-10-CM | POA: Insufficient documentation

## 2023-09-03 NOTE — Assessment & Plan Note (Addendum)
New.  Refractory symptoms.  Finish Levaquin 5 days.  Prednisone Dosepak.  Airsupra prescribed for bronchospasm/cough Hycodan as needed Augmentin if not better Obtain chest x-ray

## 2023-09-03 NOTE — Assessment & Plan Note (Signed)
Follow-up with Dr. Leone Payor - past due colonoscopy

## 2023-09-18 ENCOUNTER — Ambulatory Visit (HOSPITAL_BASED_OUTPATIENT_CLINIC_OR_DEPARTMENT_OTHER): Payer: Self-pay | Admitting: Obstetrics & Gynecology

## 2023-09-18 ENCOUNTER — Other Ambulatory Visit (HOSPITAL_BASED_OUTPATIENT_CLINIC_OR_DEPARTMENT_OTHER): Payer: Self-pay

## 2023-09-18 MED ORDER — COMIRNATY 30 MCG/0.3ML IM SUSY
0.3000 mL | PREFILLED_SYRINGE | Freq: Once | INTRAMUSCULAR | 0 refills | Status: AC
  Filled 2023-09-18: qty 0.3, 1d supply, fill #0

## 2023-09-19 ENCOUNTER — Ambulatory Visit (HOSPITAL_BASED_OUTPATIENT_CLINIC_OR_DEPARTMENT_OTHER): Payer: Commercial Managed Care - PPO | Admitting: Obstetrics & Gynecology

## 2023-09-19 ENCOUNTER — Encounter (HOSPITAL_BASED_OUTPATIENT_CLINIC_OR_DEPARTMENT_OTHER): Payer: Self-pay | Admitting: Obstetrics & Gynecology

## 2023-09-19 VITALS — BP 136/80 | HR 91 | Ht 65.75 in | Wt 153.4 lb

## 2023-09-19 DIAGNOSIS — Z13820 Encounter for screening for osteoporosis: Secondary | ICD-10-CM | POA: Diagnosis not present

## 2023-09-19 DIAGNOSIS — F419 Anxiety disorder, unspecified: Secondary | ICD-10-CM | POA: Diagnosis not present

## 2023-09-19 DIAGNOSIS — Z01419 Encounter for gynecological examination (general) (routine) without abnormal findings: Secondary | ICD-10-CM | POA: Diagnosis not present

## 2023-09-19 DIAGNOSIS — G43909 Migraine, unspecified, not intractable, without status migrainosus: Secondary | ICD-10-CM | POA: Diagnosis not present

## 2023-09-19 DIAGNOSIS — I2583 Coronary atherosclerosis due to lipid rich plaque: Secondary | ICD-10-CM

## 2023-09-19 MED ORDER — VILAZODONE HCL 20 MG PO TABS: 20.0000 mg | ORAL_TABLET | Freq: Every day | ORAL | 12 refills | Status: DC

## 2023-09-19 MED ORDER — ALPRAZOLAM 0.5 MG PO TABS: 0.5000 mg | ORAL_TABLET | Freq: Two times a day (BID) | ORAL | 0 refills | Status: DC | PRN

## 2023-09-19 MED ORDER — HYDROCODONE-ACETAMINOPHEN 5-325 MG PO TABS: 1.0000 | ORAL_TABLET | ORAL | 0 refills | Status: DC | PRN

## 2023-09-19 NOTE — Progress Notes (Signed)
67 y.o. G0P0000 Single White or Caucasian female here for AEX.  Denies vaginal bleeding.  Still working full time.  Will send me lab work from [facility] [facility].    Does need RF for xanax that she uses prn.  Uses vicodin rarely as well.  Has used these for year.    Denies vaginal bleeding.  Patient's last menstrual period was 10/03/2001 (approximate).            Health Maintenance: PCP:  Dr. Posey Rea.   Vaccines are up to date:  yes Colonoscopy:  03/22/2017 MMG:  07/26/2023 Negative BMD:  ordered Last pap smear:  03/19/2018 Negative.   H/o abnormal pap smear:      reports that she has never smoked. She has never used smokeless tobacco. She reports current alcohol use. She reports that she does not use drugs.  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 10 g 5   ALPRAZolam (XANAX) 0.5 MG tablet Take 1 tablet (0.5 mg total) by mouth 2 (two) times daily as needed. 60 tablet 0   amoxicillin-clavulanate (AUGMENTIN) 875-125 MG tablet Take 1 tablet by mouth 2 (two) times daily. 20 tablet 0   COVID-19 mRNA vaccine, Pfizer, (COMIRNATY) syringe Inject 0.3 mLs into the muscle once for 1 dose. 0.3 mL 0   rosuvastatin (CRESTOR) 40 MG tablet Take 40 mg by mouth daily.     terbinafine (LAMISIL) 250 MG tablet Take 250 mg by mouth daily.     Vilazodone HCl 20 MG TABS Take 1 tablet (20 mg total) by mouth daily. 30 tablet 12   No current facility-administered medications for this visit.    Family History   Problem Relation Age of Onset   Lung cancer Mother    Colon cancer Other 56   Colon cancer Brother    Esophageal cancer Neg Hx    Rectal cancer Neg Hx    Stomach cancer Neg Hx     Review of Systems  Constitutional: Negative.   Genitourinary: Negative.     Exam:   BP 136/80 (BP Location: Right Arm, Patient Position: Sitting, Cuff Size: Normal)   Pulse 91   Ht 5' 5.75" (1.67 m)   Wt 153 lb 6.4 oz (69.6 kg)   LMP 10/03/2001 (Approximate)   BMI 24.95 kg/m   Height: 5' 5.75" (167 cm)  General appearance: alert, cooperative and appears stated age Breasts: normal appearance, no masses or tenderness Abdomen: soft, non-tender; bowel sounds normal; no masses,  no organomegaly Lymph nodes: Cervical, supraclavicular, and axillary nodes normal.  No abnormal inguinal nodes palpated Neurologic: Grossly normal  Pelvic: External genitalia:  no lesions              Urethra:  normal appearing urethra with no masses, tenderness or lesions              Bartholins and Skenes: normal                 Vagina: normal appearing  vagina with atrophic changes and no discharge, no lesions              Cervix: no lesions              Pap taken: No. Bimanual Exam:  Uterus:  normal size, contour, position, consistency, mobility, non-tender              Adnexa: normal adnexa and no mass, fullness, tenderness               Rectovaginal: Confirms               Anus:  normal sphincter tone, no lesions  Declines chaperone  Assessment/Plan: 1. Well woman exam with routine gynecological exam (Primary) - Pap smear last done 2019 - Mammogram 07/27/2023 - Colonoscopy 2018.  She is aware this is due. - Bone mineral density ordered - lab work done at [facility] day - vaccines reviewed/updated  2. Anxiety - uses alprazolam occasionally.  Receives rx once yearly.   - ALPRAZolam (XANAX) 0.5 MG tablet; Take 1 tablet (0.5 mg total) by mouth 2 (two) times daily as needed.  Dispense: 60 tablet; Refill: 0  3.  Migraine without status migrainosus, not intractable, unspecified migraine type - HYDROcodone-acetaminophen (NORCO/VICODIN) 5-325 MG tablet; Take 1 tablet by mouth every 4 (four) hours as needed. for pain  Dispense: 20 tablet; Refill: 0  4. Osteoporosis screening - DG BONE DENSITY (DXA); Future  5. Coronary atherosclerosis due to lipid rich plaque

## 2023-10-10 ENCOUNTER — Other Ambulatory Visit (HOSPITAL_COMMUNITY): Payer: Self-pay

## 2023-10-10 ENCOUNTER — Telehealth: Payer: Self-pay

## 2023-10-10 NOTE — Telephone Encounter (Signed)
 Pharmacy Patient Advocate Encounter   Received notification from CoverMyMeds that prior authorization for Airsupra  90-80MCG/ACT aerosol is required/requested.   Insurance verification completed.   The patient is insured through [facility] .   Per test claim: PA required; PA submitted to above mentioned insurance via CoverMyMeds Key/confirmation #/EOC [id number] Status is pending

## 2023-10-17 ENCOUNTER — Other Ambulatory Visit (HOSPITAL_COMMUNITY): Payer: Self-pay

## 2023-10-17 NOTE — Telephone Encounter (Signed)
 Pharmacy Patient Advocate Encounter  Received notification from [employer] that Prior Authorization for Airsupra  90-80MCG/ACT aerosol has been APPROVED from 10/17/23 to 10/16/24. Spoke to pharmacy to process.Copay is $35.00.    PA #/Case ID/Reference #:  [id number]

## 2023-10-21 ENCOUNTER — Other Ambulatory Visit (HOSPITAL_COMMUNITY): Payer: Self-pay

## 2023-10-23 ENCOUNTER — Other Ambulatory Visit (HOSPITAL_COMMUNITY): Payer: Self-pay

## 2023-10-23 MED ORDER — AIRSUPRA 90-80 MCG/ACT IN AERO
2.0000 | INHALATION_SPRAY | RESPIRATORY_TRACT | 5 refills | Status: DC | PRN
  Filled 2023-10-23: qty 10.7, 17d supply, fill #0
  Filled 2024-01-12 – 2024-01-13 (×2): qty 10.7, 17d supply, fill #1

## 2023-10-28 ENCOUNTER — Other Ambulatory Visit (HOSPITAL_COMMUNITY): Payer: Self-pay

## 2023-12-20 DIAGNOSIS — H5213 Myopia, bilateral: Secondary | ICD-10-CM | POA: Diagnosis not present

## 2024-01-11 LAB — LAB REPORT - SCANNED
A1c: 5
EGFR (Non-African Amer.): 66
TSH: 1.54 (ref 0.41–5.90)

## 2024-01-13 ENCOUNTER — Other Ambulatory Visit (HOSPITAL_COMMUNITY): Payer: Self-pay

## 2024-02-15 ENCOUNTER — Ambulatory Visit: Admitting: Internal Medicine

## 2024-02-15 ENCOUNTER — Other Ambulatory Visit (HOSPITAL_COMMUNITY): Payer: Self-pay

## 2024-02-15 VITALS — BP 132/80 | HR 81 | Temp 98.3°F | Ht 65.75 in | Wt 147.0 lb

## 2024-02-15 DIAGNOSIS — E785 Hyperlipidemia, unspecified: Secondary | ICD-10-CM

## 2024-02-15 DIAGNOSIS — I2583 Coronary atherosclerosis due to lipid rich plaque: Secondary | ICD-10-CM

## 2024-02-15 DIAGNOSIS — J45909 Unspecified asthma, uncomplicated: Secondary | ICD-10-CM | POA: Insufficient documentation

## 2024-02-15 DIAGNOSIS — R053 Chronic cough: Secondary | ICD-10-CM | POA: Diagnosis not present

## 2024-02-15 DIAGNOSIS — J4521 Mild intermittent asthma with (acute) exacerbation: Secondary | ICD-10-CM

## 2024-02-15 MED ORDER — TRELEGY ELLIPTA 100-62.5-25 MCG/ACT IN AEPB
1.0000 | INHALATION_SPRAY | Freq: Every day | RESPIRATORY_TRACT | 11 refills | Status: DC
Start: 2024-02-15 — End: 2024-02-21
  Filled 2024-02-15: qty 60, 30d supply, fill #0

## 2024-02-15 MED ORDER — BENZONATATE 200 MG PO CAPS
200.0000 mg | ORAL_CAPSULE | Freq: Two times a day (BID) | ORAL | 1 refills | Status: AC | PRN
Start: 2024-02-15 — End: ?
  Filled 2024-02-15: qty 60, 30d supply, fill #0
  Filled 2024-07-11: qty 60, 30d supply, fill #1

## 2024-02-15 MED ORDER — HYDROCODONE BIT-HOMATROP MBR 5-1.5 MG/5ML PO SOLN
5.0000 mL | Freq: Four times a day (QID) | ORAL | 0 refills | Status: AC | PRN
Start: 2024-02-15 — End: ?
  Filled 2024-02-15: qty 132, 7d supply, fill #0
  Filled 2024-02-16: qty 108, 5d supply, fill #0

## 2024-02-15 MED ORDER — LORATADINE 10 MG PO TABS: 10.0000 mg | ORAL_TABLET | Freq: Every day | ORAL | Status: DC

## 2024-02-15 MED ORDER — PANTOPRAZOLE SODIUM 40 MG PO TBEC
40.0000 mg | DELAYED_RELEASE_TABLET | Freq: Every day | ORAL | 1 refills | Status: DC
  Filled 2024-02-15: qty 90, 90d supply, fill #0

## 2024-02-15 NOTE — Progress Notes (Signed)
 Subjective:  Patient ID: Jessica Martin, female    DOB: [birth year]  Age: 68 y.o. MRN: [id number]  CC: Cough (Pt has a wet persistent cough x3 1/2 weeks a/w whitish mucus coming up occasionally. Pt has been on prednisone and this has happened.)   HPI Jessica Martin presents for chronic cough, worse lately  Airsupra  helps.  Trelegy helped better. 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 She took prednisone (still on it), and antibiotic, use the inhaler  Outpatient Medications Prior to Visit  Medication Sig Dispense Refill   Albuterol -Budesonide  (AIRSUPRA ) 90-80 MCG/ACT AERO Inhale 2 Inhalations into the lungs every 4 (four) hours as needed. 10 g 5   ALPRAZolam  (XANAX ) 0.5 MG tablet Take 1 tablet (0.5 mg total) by mouth 2 (two) times daily as needed. 60 tablet 0   HYDROcodone -acetaminophen  (NORCO/VICODIN) 5-325 MG tablet Take 1 tablet by mouth every 4 (four) hours as needed. for pain 20 tablet 0   rosuvastatin  (CRESTOR ) 40 MG tablet Take 40 mg by mouth daily.     Albuterol -Budesonide  (AIRSUPRA ) 90-80 MCG/ACT AERO Inhale 2 puffs by mouth into lungs every 4 (four) hours as needed. 10.7 g 5   terbinafine (LAMISIL) 250 MG tablet Take 250 mg by mouth daily.     Vilazodone  HCl 20 MG TABS Take 1 tablet (20 mg total) by mouth daily. 30 tablet 12   No facility-administered medications prior to visit.    ROS: Review of Systems  Constitutional:  Positive for fatigue. Negative for activity change, appetite change, chills and unexpected weight change.  HENT:  Positive for congestion. Negative for mouth sores, sinus pressure, sinus pain, sore throat, trouble swallowing and voice change.   Eyes:  Negative for visual disturbance.  Respiratory:  Positive for cough, shortness of breath and wheezing. Negative for chest tightness and stridor.   Gastrointestinal:  Negative for abdominal pain and nausea.   Genitourinary:  Negative for difficulty urinating, frequency and vaginal pain.  Musculoskeletal:  Negative for back pain and gait problem.  Skin:  Negative for pallor and rash.  Neurological:  Negative for dizziness, tremors, weakness, numbness and headaches.  Psychiatric/Behavioral:  Negative for confusion and sleep disturbance.     Objective:  BP 132/80   Pulse 81   Temp 98.3 F (36.8 C) (Oral)   Ht 5' 5.75" (1.67 m)   Wt 147 lb (66.7 kg)   LMP 10/03/2001 (Approximate)   SpO2 96%   BMI 23.91 kg/m   BP Readings from Last 3 Encounters:  02/15/24 132/80  09/19/23 136/80  08/30/23 122/78    Wt Readings from Last 3 Encounters:  02/15/24 147 lb (66.7 kg)  09/19/23 153 lb 6.4 oz (69.6 kg)  08/30/23 153 lb (69.4 kg)    Physical Exam Constitutional:      General: She is not in acute distress.    Appearance: She is well-developed. She is not ill-appearing or toxic-appearing.  HENT:     Head: Normocephalic.     Right Ear: External ear normal.     Left Ear: External ear normal.     Nose: Nose normal.  Eyes:     General:        Right eye: No discharge.        Left eye: No discharge.     Conjunctiva/sclera: Conjunctivae normal.     Pupils: Pupils are equal, round, and reactive to light.  Neck:     Thyroid: No thyromegaly.     Vascular: No JVD.     Trachea: No tracheal deviation.  Cardiovascular:     Rate and Rhythm: Normal rate and regular rhythm.     Heart sounds: Normal heart sounds.  Pulmonary:     Effort: Pulmonary effort is normal. No respiratory distress.     Breath sounds: No stridor. Rhonchi and rales present. No wheezing.  Abdominal:     General: Bowel sounds are normal. There is no distension.     Palpations: Abdomen is soft. There is no mass.     Tenderness: There is no abdominal tenderness. There is no guarding or rebound.  Musculoskeletal:        General: No tenderness.     Cervical back: Normal range of motion and neck supple. No rigidity.     Right  lower leg: No edema.     Left lower leg: No edema.  Lymphadenopathy:     Cervical: No cervical adenopathy.  Skin:    Findings: No erythema or rash.  Neurological:     Cranial Nerves: No cranial nerve deficit.     Motor: No abnormal muscle tone.     Coordination: Coordination normal.     Deep Tendon Reflexes: Reflexes normal.  Psychiatric:        Behavior: Behavior normal.        Thought Content: Thought content normal.        Judgment: Judgment normal.     Lab Results  Component Value Date   WBC 5.7 07/05/2019   HGB 14.0 07/05/2019   HCT 42.3 07/05/2019   PLT 304 07/05/2019   GLUCOSE 86 07/05/2019   CHOL 205 (H) 07/05/2019   TRIG 119 07/05/2019   HDL 86 07/05/2019   LDLCALC 97 07/05/2019   ALT 11 07/05/2019   AST 13 07/05/2019   NA 141 07/05/2019   K 4.5 07/05/2019   CL 106 07/05/2019   CREATININE 0.92 07/05/2019   BUN 12 07/05/2019   CO2 27 07/05/2019   TSH 0.43 07/05/2019    MM 3D SCREENING MAMMOGRAM BILATERAL BREAST Result Date: 07/27/2023 CLINICAL DATA:  Screening. EXAM: DIGITAL SCREENING BILATERAL MAMMOGRAM WITH TOMOSYNTHESIS AND CAD TECHNIQUE: Bilateral screening digital craniocaudal and mediolateral oblique mammograms were obtained. Bilateral screening digital breast tomosynthesis was performed. The images were evaluated with computer-aided detection. COMPARISON:  Previous exam(s). ACR Breast Density Category b: There are scattered areas of fibroglandular density. FINDINGS: There are no findings suspicious for malignancy. IMPRESSION: No mammographic evidence of malignancy. A result letter of this screening mammogram will be mailed directly to the patient. RECOMMENDATION: Screening mammogram in one year. (Code:SM-B-01Y) BI-RADS CATEGORY  1: Negative. Electronically Signed   By: Alinda Apley M.D.   On: 07/27/2023 16:42    Assessment & Plan:   Problem List Items Addressed This Visit     Elevated lipids   On Crestor       Asthmatic bronchitis   Worse. On  Sat Dr. Bjorn Bullocks was waiting on her takeout meal in the restaurant at the bar counter-she developed a bronchial spasm that really scared her. She took prednisone (still on it), and antibiotic, use the inhaler Taper off prednisone Start Trelegy.  Continue Airsupra  Pulmonary consultation with Dr. Waymond Hailey      Relevant Medications   Fluticasone -Umeclidin-Vilant (TRELEGY ELLIPTA ) 100-62.5-25 MCG/ACT AEPB   Chronic cough - Primary   Worse.  Hycodan as needed restart Trelegy.  The pulmonary consultation with Dr. Waymond Hailey  Relevant Orders   Ambulatory referral to Pulmonology      Meds ordered this encounter  Medications   loratadine  (CLARITIN ) 10 MG tablet    Sig: Take 1 tablet (10 mg total) by mouth daily.   Fluticasone -Umeclidin-Vilant (TRELEGY ELLIPTA ) 100-62.5-25 MCG/ACT AEPB    Sig: Inhale 1 puff into the lungs daily.    Dispense:  60 each    Refill:  11   pantoprazole  (PROTONIX ) 40 MG tablet    Sig: Take 1 tablet (40 mg total) by mouth daily.    Dispense:  90 tablet    Refill:  1   benzonatate  (TESSALON ) 200 MG capsule    Sig: Take 1 capsule (200 mg total) by mouth 2 (two) times daily as needed for cough.    Dispense:  60 capsule    Refill:  1   HYDROcodone  bit-homatropine (HYCODAN) 5-1.5 MG/5ML syrup    Sig: Take 5 mLs by mouth every 6 (six) hours as needed for cough.    Dispense:  240 mL    Refill:  0      Follow-up: No follow-ups on file.  Anitra Barn, MD

## 2024-02-15 NOTE — Assessment & Plan Note (Addendum)
 Worse. 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 She took prednisone (still on it), and antibiotic, use the inhaler Taper off prednisone Start Trelegy.  Continue Airsupra  Pulmonary consultation with Dr. Waymond Hailey

## 2024-02-16 ENCOUNTER — Telehealth: Payer: Self-pay | Admitting: *Deleted

## 2024-02-16 ENCOUNTER — Other Ambulatory Visit (HOSPITAL_COMMUNITY): Payer: Self-pay

## 2024-02-16 ENCOUNTER — Encounter: Payer: Self-pay | Admitting: Internal Medicine

## 2024-02-16 NOTE — Telephone Encounter (Signed)
-----   Message from Vernestine Gondola sent at 02/15/2024  5:33 PM EDT ----- Regarding: RE: cough Sure we'll get her in - thx Liberty : see if she'll come in at noon tomorrow or come see me in [place] next week? ----- Message ----- From: Genia Kettering, MD Sent: 02/15/2024   5:10 PM EDT To: Diamond Formica, MD Subject: cough                                          Hi Jessica Martin,  Could you please see Dr Jessica Martin  She has been bothered with a chronic cough with a recent bronchial spasm attack 5 days ago which scared her quiet a  bit.  Today she is having musical rhonchi B. She started herself on Doxy and steroids. I gave her Trelegy, Hycodan, Protonix.  No PFT or CT yet.  Thank you for all your help!!! AP

## 2024-02-16 NOTE — Telephone Encounter (Signed)
 Per Dr Waymond Hailey ok to see her anytime- preferred end of clinic  I called the pt and there was no answer- LMTCB

## 2024-02-19 ENCOUNTER — Encounter: Payer: Self-pay | Admitting: Internal Medicine

## 2024-02-19 DIAGNOSIS — R053 Chronic cough: Secondary | ICD-10-CM | POA: Insufficient documentation

## 2024-02-19 NOTE — Assessment & Plan Note (Signed)
 On Crestor

## 2024-02-19 NOTE — Assessment & Plan Note (Addendum)
 Worse.  Hycodan as needed restart Trelegy.  The pulmonary consultation with Dr. Waymond Hailey

## 2024-02-20 ENCOUNTER — Encounter: Payer: Self-pay | Admitting: *Deleted

## 2024-02-20 NOTE — Telephone Encounter (Signed)
 Patient is scheduled in [place] with Dr. Waymond Hailey tomorrow, 5/21 at 3:30pm.

## 2024-02-21 ENCOUNTER — Other Ambulatory Visit (HOSPITAL_COMMUNITY): Payer: Self-pay

## 2024-02-21 ENCOUNTER — Encounter: Payer: Self-pay | Admitting: Internal Medicine

## 2024-02-21 ENCOUNTER — Ambulatory Visit: Admitting: Internal Medicine

## 2024-02-21 VITALS — BP 155/89 | HR 74 | Ht 65.75 in | Wt 148.2 lb

## 2024-02-21 DIAGNOSIS — J45991 Cough variant asthma: Secondary | ICD-10-CM | POA: Diagnosis not present

## 2024-02-21 MED ORDER — BUDESONIDE-FORMOTEROL FUMARATE 80-4.5 MCG/ACT IN AERO
INHALATION_SPRAY | RESPIRATORY_TRACT | 12 refills | Status: DC
  Filled 2024-02-21: qty 10.2, 28d supply, fill #0
  Filled 2024-03-21: qty 10.2, 28d supply, fill #1
  Filled 2024-04-28: qty 10.2, 28d supply, fill #2
  Filled 2024-05-31: qty 10.2, 28d supply, fill #3
  Filled 2024-07-11: qty 10.2, 28d supply, fill #4

## 2024-02-21 NOTE — Patient Instructions (Signed)
 Plan A = Automatic = Always=    Symbicort 80 Take 2 puffs first thing in am and then another 2 puffs about 12 hours later.    Work on inhaler technique:  relax and gently blow all the way out then take a nice smooth full deep breath back in, triggering the inhaler at same time you start breathing in.  Hold breath in for at least  5 seconds if you can. Blow out symbicort  thru nose. Rinse and gargle with water when done.  If mouth or throat bother you at all,  try brushing teeth/gums/tongue with arm and hammer toothpaste/ make a slurry and gargle and spit out.     Plan B = Backup (to supplement plan A, not to replace it) Only use your air supra as a rescue medication to be used if you can't catch your breath by resting or doing a relaxed purse lip breathing pattern.  - The less you use it, the better it will work when you need it. - Ok to use the inhaler up to 2 puffs  every 4 hours  - Don't leave home without it !!  (think of it like the spare tire for your car)   Please remember to go to the lab department   for your tests - we will call you with the results when they are available.      I will see you back in [place] as needed

## 2024-02-21 NOTE — Assessment & Plan Note (Addendum)
 Onset fall 2024 responsive to prednisone - 02/21/2024  After extensive coaching inhaler device,  effectiveness =   90% > try symbicort 80 2bid and prn air supra  - Allergy screen 02/21/2024 >  Eos 0. /  IgE    Response to pred and cough on exp are typical of cough variant asthma so trial of symbicort 80 approp with air supra as backup.  Would avoid high dose IC if possible and all forms of DPI's   in this setting as they are most likely to cause  upper airway cough which mimicks cough variant asthma.   F/u in [place] prn      Each maintenance medication was reviewed in detail including emphasizing most importantly the difference between maintenance and prns and under what circumstances the prns are to be triggered using an action plan format where appropriate.  Total time for H and P, chart review, counseling, reviewing hfa device(s) and generating customized AVS unique to this office visit / same day charting = 45 min new pt eval and rx

## 2024-02-21 NOTE — Progress Notes (Signed)
 ARIEANA SOMOZA, female    DOB: Jun 29, 1956    MRN: [id number]   Brief patient profile:  67  yowf  never smoker passive exp with freq URI's better p tonsilectomy  referred to pulmonary clinic in [facility]  02/21/2024 by Plotnikov MD  for chronic cough onset Fall 2024.  Allergy eval by [facility] when 20s for sneezing no cough pos dogs/ cats/ horses.   Trelegy started in Nov 2024 helped a lot but stopped it then changed to air supra.  Still has cats in bedroom/ no overt hb       History of Present Illness  02/21/2024  Pulmonary/ 1st office eval/ Erron Wengert / [place] [facility]  Chief Complaint  Patient presents with   Establish Care   Cough  Dyspnea:  none  Cough: no cough x 3 d on ppi several days on prednisone > min white white mucus production  Sleep: flat bed one pillow  no longer waking up with cough  SABA use: minimal 02: none     No obvious day to day or daytime pattern/variability or assoc excess/ purulent sputum or mucus plugs or hemoptysis or cp or chest tightness, subjective wheeze or overt   hb symptoms.    Also denies any obvious fluctuation of symptoms with weather or environmental changes or other aggravating or alleviating factors except as outlined above   No unusual exposure hx or h/o childhood pna/ asthma or knowledge of premature birth.  Current Allergies, Complete Past Medical History, Past Surgical History, Family History, and Social History were reviewed in [employer] record.  ROS  The following are not active complaints unless bolded Hoarseness, sore throat, dysphagia, dental problems, itching, sneezing,  nasal congestion or discharge of excess mucus or purulent secretions, ear ache,   fever, chills, sweats, unintended wt loss or wt gain, classically pleuritic or exertional cp,  orthopnea pnd or arm/hand swelling  or leg swelling, presyncope, palpitations, abdominal pain, anorexia, nausea, vomiting, diarrhea  or change in bowel habits or  change in bladder habits, change in stools or change in urine, dysuria, hematuria,  rash, arthralgias, visual complaints, headache, numbness, weakness or ataxia or problems with walking or coordination,  change in mood or  memory.            Outpatient Medications Prior to Visit  Medication Sig Dispense Refill   Albuterol -Budesonide  (AIRSUPRA ) 90-80 MCG/ACT AERO Inhale 2 Inhalations into the lungs every 4 (four) hours as needed. 10 g 5   ALPRAZolam  (XANAX ) 0.5 MG tablet Take 1 tablet (0.5 mg total) by mouth 2 (two) times daily as needed. 60 tablet 0   benzonatate  (TESSALON ) 200 MG capsule Take 1 capsule (200 mg total) by mouth 2 (two) times daily as needed for cough. 60 capsule 1   HYDROcodone  bit-homatropine (HYCODAN) 5-1.5 MG/5ML syrup Take 5 mLs by mouth every 6 (six) hours as needed for cough. 240 mL 0   HYDROcodone -acetaminophen  (NORCO/VICODIN) 5-325 MG tablet Take 1 tablet by mouth every 4 (four) hours as needed. for pain 20 tablet 0   loratadine  (CLARITIN ) 10 MG tablet Take 1 tablet (10 mg total) by mouth daily.     pantoprazole  (PROTONIX ) 40 MG tablet Take 1 tablet (40 mg total) by mouth daily. 90 tablet 1   rosuvastatin  (CRESTOR ) 40 MG tablet Take 40 mg by mouth daily.     Fluticasone -Umeclidin-Vilant (TRELEGY ELLIPTA ) 100-62.5-25 MCG/ACT AEPB Inhale 1 puff into the lungs daily. 60 each 11   No facility-administered medications prior to visit.  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   GERD (gastroesophageal reflux disease)    Hx of adenomatous colonic polyps 03/29/2017   Mixed hyperlipidemia       Objective:     BP (!) 155/89 (BP Location: Left Arm)   Pulse 74   Ht 5' 5.75" (1.67 m)   Wt 148 lb 3.2 oz (67.2 kg)   LMP 10/03/2001 (Approximate)   SpO2 97% Comment: RA  BMI 24.10 kg/m   SpO2: 97 % (RA) amb pleasant wf nad    HEENT : Oropharynx  nl      Nasal turbinates nl    NECK :  without  apparent  JVD/ palpable Nodes/TM    LUNGS: no acc muscle use,  Nl contour chest minimal exp wheeze/ cough better with plm    CV:  RRR  no s3 or murmur or increase in P2, and no edema   ABD:  soft and nontender   MS:  Gait nl   ext warm without deformities Or obvious joint restrictions  calf tenderness, cyanosis or clubbing    SKIN: warm and dry without lesions    NEURO:  alert, approp, nl sensorium with  no motor or cerebellar deficits apparent.    I personally reviewed images and agree with radiology impression as follows:  CXR:   pa and latersal 08/30/23  copd with no active dz My impression/ wnl x  very mild kyphosis/ no copd      Assessment   Cough variant asthma Onset fall 2024 responsive to prednisone - 02/21/2024  After extensive coaching inhaler device,  effectiveness =   90% > try symbicort 80 2bid and prn air supra  - Allergy screen 02/21/2024 >  Eos 0. /  IgE    Response to pred and cough on exp are typical of cough variant asthma so trial of symbicort 80 approp with air supra as backup.  Would avoid high dose IC if possible and all forms of DPI's   in this setting as they are most likely to cause  upper airway cough which mimicks cough variant asthma.   F/u in [place] prn      Each maintenance medication was reviewed in detail including emphasizing most importantly the difference between maintenance and prns and under what circumstances the prns are to be triggered using an action plan format where appropriate.  Total time for H and P, chart review, counseling, reviewing hfa device(s) and generating customized AVS unique to this office visit / same day charting = 45 min new pt eval and rx           Vernestine Gondola, MD 02/21/2024

## 2024-02-23 ENCOUNTER — Ambulatory Visit: Payer: Self-pay | Admitting: Internal Medicine

## 2024-02-23 LAB — CBC WITH DIFFERENTIAL/PLATELET
Basophils Absolute: 0.1 10*3/uL (ref 0.0–0.2)
Basos: 1 %
EOS (ABSOLUTE): 0.2 10*3/uL (ref 0.0–0.4)
Eos: 2 %
Hematocrit: 42.8 % (ref 34.0–46.6)
Hemoglobin: 14.4 g/dL (ref 11.1–15.9)
Immature Grans (Abs): 0.1 10*3/uL (ref 0.0–0.1)
Immature Granulocytes: 1 %
Lymphocytes Absolute: 1.9 10*3/uL (ref 0.7–3.1)
Lymphs: 28 %
MCH: 29.8 pg (ref 26.6–33.0)
MCHC: 33.6 g/dL (ref 31.5–35.7)
MCV: 89 fL (ref 79–97)
Monocytes Absolute: 0.6 10*3/uL (ref 0.1–0.9)
Monocytes: 8 %
Neutrophils Absolute: 4 10*3/uL (ref 1.4–7.0)
Neutrophils: 60 %
Platelets: 310 10*3/uL (ref 150–450)
RBC: 4.83 x10E6/uL (ref 3.77–5.28)
RDW: 12.1 % (ref 11.7–15.4)
WBC: 6.8 10*3/uL (ref 3.4–10.8)

## 2024-02-23 LAB — IGE: IgE (Immunoglobulin E), Serum: 5 [IU]/mL — ABNORMAL LOW (ref 6–495)

## 2024-03-13 ENCOUNTER — Ambulatory Visit: Admitting: Internal Medicine

## 2024-03-13 ENCOUNTER — Encounter: Payer: Self-pay | Admitting: Internal Medicine

## 2024-03-13 ENCOUNTER — Other Ambulatory Visit (HOSPITAL_COMMUNITY): Payer: Self-pay

## 2024-03-13 ENCOUNTER — Ambulatory Visit

## 2024-03-13 VITALS — BP 136/84 | HR 92 | Ht 65.75 in | Wt 146.0 lb

## 2024-03-13 DIAGNOSIS — J45991 Cough variant asthma: Secondary | ICD-10-CM

## 2024-03-13 DIAGNOSIS — R059 Cough, unspecified: Secondary | ICD-10-CM | POA: Diagnosis not present

## 2024-03-13 MED ORDER — AZITHROMYCIN 250 MG PO TABS
ORAL_TABLET | ORAL | 0 refills | Status: DC
  Filled 2024-03-13: qty 6, 5d supply, fill #0

## 2024-03-13 MED ORDER — PREDNISONE 10 MG PO TABS
ORAL_TABLET | ORAL | 0 refills | Status: DC
  Filled 2024-03-13: qty 14, 6d supply, fill #0

## 2024-03-13 NOTE — Assessment & Plan Note (Signed)
 Onset fall 2024 responsive to prednisone - 02/21/2024  After extensive coaching inhaler device,  effectiveness =   90% > try symbicort  80 2bid and prn air supra  - Allergy screen 02/21/2024 >  Eos 0.2 /  IgE  5 - 03/13/2024  After extensive coaching inhaler device,  effectiveness =    75% with spacer > continue symbicort  80 and prn air supra - 03/13/2024 rx pred x 6 d, zmax / max gerd rx and mucinex dm  Likely a viral tracheobronchitis  although have not ruled out sinusitis as well so if not better next step is sinus CT  Discussed in detail all the  indications, usual  risks and alternatives  relative to the benefits with patient who agrees to proceed with Rx as outlined.             Each maintenance medication was reviewed in detail including emphasizing most importantly the difference between maintenance and prns and under what circumstances the prns are to be triggered using an action plan format where appropriate.  Total time for H and P, chart review, counseling, reviewing hfa/spacer  device(s) and generating customized AVS unique to this office visit / same day charting = 31 min

## 2024-03-13 NOTE — Patient Instructions (Addendum)
 Mucinex dm 1200 mg every 12 hours with glass of water as needed  Pantoprazole  (protonix ) 40 mg   Take  30-60 min before first meal of the day and Pepcid (famotidine)  20 mg after supper until return to office - this is the best way to tell whether stomach acid is contributing to your problem.    GERD (REFLUX)  is an extremely common cause of respiratory symptoms just like yours , many times with no obvious heartburn at all.    It can be treated with medication, but also with lifestyle changes including elevation of the head of your bed (ideally with 6 -8inch blocks under the headboard of your bed),  Smoking cessation, avoidance of late meals, excessive alcohol, and avoid fatty foods, chocolate, peppermint, colas, red wine, and acidic juices such as orange juice.  NO MINT OR MENTHOL PRODUCTS SO NO COUGH DROPS  USE SUGARLESS CANDY INSTEAD (Jolley ranchers or Stover's or Life Savers) or even ice chips will also do - the key is to swallow to prevent all throat clearing. NO OIL BASED VITAMINS - use powdered substitutes.  Avoid fish oil when coughing.   Prednisone 10 mg take  4 each am x 2 days,   2 each am x 2 days,  1 each am x 2 days and stop   Plan A = Automatic = Always=    Symbicort  80 Take 2 puffs first thing in am and then another 2 puffs about 12 hours later.  Work on inhaler technique:   Plan B = Backup (to supplement plan A, not to replace it) Only use your air supra as a rescue medication  up 2 puffs     Please remember to go to the  x-ray department  for your tests - we will call you with the results when they are available

## 2024-03-13 NOTE — Progress Notes (Signed)
 Jessica Martin, female    DOB: Jul 22, 1956    MRN: [id number]   Brief patient profile:  2  yowf  never smoker passive exp with freq URI's better p tonsilectomy  referred to pulmonary clinic in [facility]  02/21/2024 by Plotnikov MD  for chronic cough onset late  Fall 2024.  Allergy eval by [facility] when 20s for sneezing no cough pos dogs/ cats/ horses.   Trelegy started in Nov 2024 helped a lot but stopped it then changed to air supra.  Still has cats in bedroom/ no overt hb       History of Present Illness  02/21/2024  Pulmonary/ 1st office eval/ Kylyn Sookram / [place] [facility]  Chief Complaint  Patient presents with   Establish Care   Cough  Dyspnea:  none  Cough: no cough x 3 d on ppi several days on prednisone > min white white mucus production  Sleep: flat bed one pillow  no longer waking up with cough  SABA use: minimal 02: none  Rec Plan A = Automatic = Always=    Symbicort  80 Take 2 puffs first thing in am and then another 2 puffs about 12 hours later.  Work on inhaler technique:  Plan B = Backup (to supplement plan A, not to replace it) Only use your air supra as a rescue medication      03/13/2024  f/u ov/Mykaylah Ballman re: AB   maint on symbicort  80   Chief Complaint  Patient presents with   Follow-up    Coughing with white mucus she thought that she was getting better, she using the inhaler and using cough meds as needed   Dyspnea:  Not limited by breathing from desired activities   Cough: white phlegmy  Sleeping:  flat be one pillow does not flare hs or am  SABA use: none  02: none       No obvious day to day or daytime variability or assoc excess/ purulent sputum or mucus plugs or hemoptysis or cp or chest tightness, subjective wheeze or overt sinus or hb symptoms.    Also denies any obvious fluctuation of symptoms with weather or environmental changes or other aggravating or alleviating factors except as outlined above   No unusual exposure hx or h/o childhood pna/  asthma or knowledge of premature birth.  Current Allergies, Complete Past Medical History, Past Surgical History, Family History, and Social History were reviewed in [employer] record.  ROS  The following are not active complaints unless bolded Hoarseness, sore throat, dysphagia, dental problems, itching, sneezing,  nasal congestion or discharge of excess mucus or purulent secretions, ear ache,   fever, chills, sweats, unintended wt loss or wt gain, classically pleuritic or exertional cp,  orthopnea pnd or arm/hand swelling  or leg swelling, presyncope, palpitations, abdominal pain, anorexia, nausea, vomiting, diarrhea  or change in bowel habits or change in bladder habits, change in stools or change in urine, dysuria, hematuria,  rash, arthralgias, visual complaints, headache, numbness, weakness or ataxia or problems with walking or coordination,  change in mood or  memory.        Current Meds  Medication Sig   ALPRAZolam  (XANAX ) 0.5 MG tablet Take 1 tablet (0.5 mg total) by mouth 2 (two) times daily as needed.   benzonatate  (TESSALON ) 200 MG capsule Take 1 capsule (200 mg total) by mouth 2 (two) times daily as needed for cough.   budesonide -formoterol  (SYMBICORT ) 80-4.5 MCG/ACT inhaler Inhale 2 puffs first thing in the  morning.  Inhale second dose (2 puffs) about 12 hours later.   HYDROcodone  bit-homatropine (HYCODAN) 5-1.5 MG/5ML syrup Take 5 mLs by mouth every 6 (six) hours as needed for cough.   loratadine  (CLARITIN ) 10 MG tablet Take 1 tablet (10 mg total) by mouth daily.   pantoprazole  (PROTONIX ) 40 MG tablet Take 1 tablet (40 mg total) by mouth daily.   rosuvastatin  (CRESTOR ) 40 MG tablet Take 40 mg by mouth daily.           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   GERD (gastroesophageal reflux disease)    Hx of adenomatous colonic polyps 03/29/2017   Mixed hyperlipidemia       Objective:       Wt Readings from Last 3 Encounters:  03/13/24 146 lb (66.2 kg)  02/21/24 148 lb 3.2 oz (67.2 kg)  02/15/24 147 lb (66.7 kg)      Vital signs reviewed  03/13/2024  - Note at rest 02 sats  98% on RA   General appearance:    amb pleasant wf with very congested sounding cough       HEENT : Oropharynx  clear      Nasal turbinates mild pallor, no excess secretions    NECK :  without  apparent JVD/ palpable Nodes/TM    LUNGS: no acc muscle use,  Nl contour chest  with insp/ exp rhonchi and some worse coughing during FVC maneuvers    CV:  RRR  no s3 or murmur or increase in P2, and no edema   ABD:  soft and nontender   MS:  Gait nl   ext warm without deformities Or obvious joint restrictions  calf tenderness, cyanosis or clubbing    SKIN: warm and dry without lesions    NEURO:  alert, approp, nl sensorium with  no motor or cerebellar deficits apparent.  Assessment

## 2024-03-15 ENCOUNTER — Encounter: Payer: Self-pay | Admitting: Internal Medicine

## 2024-03-15 ENCOUNTER — Ambulatory Visit: Payer: Self-pay | Admitting: Internal Medicine

## 2024-03-23 ENCOUNTER — Other Ambulatory Visit (HOSPITAL_COMMUNITY): Payer: Self-pay

## 2024-04-01 ENCOUNTER — Telehealth: Payer: Self-pay | Admitting: Internal Medicine

## 2024-04-01 DIAGNOSIS — Z9229 Personal history of other drug therapy: Secondary | ICD-10-CM

## 2024-04-01 NOTE — Telephone Encounter (Signed)
 Dr. Perri is requesting an order for a measles titer.

## 2024-04-04 NOTE — Telephone Encounter (Signed)
 Okay, it is ordered.  Thanks

## 2024-04-11 DIAGNOSIS — L820 Inflamed seborrheic keratosis: Secondary | ICD-10-CM | POA: Diagnosis not present

## 2024-04-11 DIAGNOSIS — D485 Neoplasm of uncertain behavior of skin: Secondary | ICD-10-CM | POA: Diagnosis not present

## 2024-04-24 NOTE — Progress Notes (Signed)
 I called and spoke to pt. Pt informed of Dr Chari note and pt verbalized understanding. NFN

## 2024-06-08 ENCOUNTER — Other Ambulatory Visit (HOSPITAL_COMMUNITY): Payer: Self-pay

## 2024-06-11 ENCOUNTER — Other Ambulatory Visit: Payer: Self-pay | Admitting: Obstetrics & Gynecology

## 2024-06-11 DIAGNOSIS — Z1231 Encounter for screening mammogram for malignant neoplasm of breast: Secondary | ICD-10-CM

## 2024-06-26 ENCOUNTER — Other Ambulatory Visit (HOSPITAL_BASED_OUTPATIENT_CLINIC_OR_DEPARTMENT_OTHER): Payer: Self-pay

## 2024-06-26 MED ORDER — COMIRNATY 30 MCG/0.3ML IM SUSY
0.3000 mL | PREFILLED_SYRINGE | Freq: Once | INTRAMUSCULAR | 0 refills | Status: AC
Start: 2024-06-26 — End: 2024-06-27
  Filled 2024-06-26: qty 0.3, 1d supply, fill #0

## 2024-07-03 ENCOUNTER — Other Ambulatory Visit: Payer: Self-pay | Admitting: Internal Medicine

## 2024-07-03 ENCOUNTER — Other Ambulatory Visit (HOSPITAL_BASED_OUTPATIENT_CLINIC_OR_DEPARTMENT_OTHER): Payer: Self-pay

## 2024-07-03 MED ORDER — AREXVY 120 MCG/0.5ML IM SUSR: 0.5000 mL | Freq: Once | INTRAMUSCULAR | Status: DC

## 2024-07-03 MED ORDER — AREXVY 120 MCG/0.5ML IM SUSR
0.5000 mL | Freq: Once | INTRAMUSCULAR | 0 refills | Status: DC
  Filled 2024-07-03: qty 0.5, 1d supply, fill #0

## 2024-07-03 MED ORDER — AREXVY 120 MCG/0.5ML IM SUSR: 0.5000 mL | Freq: Once | INTRAMUSCULAR | Status: AC

## 2024-07-04 ENCOUNTER — Other Ambulatory Visit (HOSPITAL_BASED_OUTPATIENT_CLINIC_OR_DEPARTMENT_OTHER): Payer: Self-pay

## 2024-07-04 ENCOUNTER — Encounter: Payer: Self-pay | Admitting: Internal Medicine

## 2024-07-06 ENCOUNTER — Other Ambulatory Visit (HOSPITAL_BASED_OUTPATIENT_CLINIC_OR_DEPARTMENT_OTHER): Payer: Self-pay

## 2024-07-10 ENCOUNTER — Telehealth: Payer: Self-pay | Admitting: Internal Medicine

## 2024-07-10 NOTE — Telephone Encounter (Signed)
 PT is asking to be seen by Dr.Wert she wants to know if there is any way he can fit her in on a WED. She is not feeling good and still has a bad cough. She only wants to see DR.Wert.

## 2024-07-11 ENCOUNTER — Other Ambulatory Visit: Payer: Self-pay | Admitting: Internal Medicine

## 2024-07-11 ENCOUNTER — Other Ambulatory Visit: Payer: Self-pay

## 2024-07-11 DIAGNOSIS — J45991 Cough variant asthma: Secondary | ICD-10-CM

## 2024-07-12 ENCOUNTER — Other Ambulatory Visit: Payer: Self-pay

## 2024-07-12 ENCOUNTER — Other Ambulatory Visit (HOSPITAL_COMMUNITY): Payer: Self-pay

## 2024-07-12 MED ORDER — AZITHROMYCIN 250 MG PO TABS
ORAL_TABLET | ORAL | 0 refills | Status: DC
  Filled 2024-07-12: qty 6, 5d supply, fill #0

## 2024-07-12 MED ORDER — PREDNISONE 10 MG PO TABS
ORAL_TABLET | ORAL | 0 refills | Status: DC
  Filled 2024-07-12: qty 14, 6d supply, fill #0

## 2024-07-12 NOTE — Telephone Encounter (Signed)
 Jessica Ozell NOVAK, MD to Me      07/12/24 12:05 PM Same already addressed this    Sorry, but I do not see another msg that addressed this.

## 2024-07-12 NOTE — Telephone Encounter (Signed)
 I called and spoke with the pt and she states her same cough has returned  She is coughing up some white sputum  No fevers, chills, aches  She states Dr Darlean had mentioned possible referral to allergy at last visit  We have opening with [facility] in [place] 10/15 but the pt unable to come in at that time  She is still taking her symbicort  and she is on nexium 20 mg in the am and pepcid at night  She stopped the pantoprazole  due to allergic reaction- I added this to her list of allergies  Please advise, thanks

## 2024-07-15 ENCOUNTER — Encounter (HOSPITAL_BASED_OUTPATIENT_CLINIC_OR_DEPARTMENT_OTHER): Payer: Self-pay | Admitting: Obstetrics & Gynecology

## 2024-07-29 ENCOUNTER — Ambulatory Visit: Admitting: Internal Medicine

## 2024-07-29 ENCOUNTER — Other Ambulatory Visit (HOSPITAL_COMMUNITY): Payer: Self-pay

## 2024-07-29 ENCOUNTER — Encounter: Payer: Self-pay | Admitting: Internal Medicine

## 2024-07-29 VITALS — BP 126/82 | HR 95 | Temp 97.9°F | Ht 65.75 in | Wt 141.0 lb

## 2024-07-29 DIAGNOSIS — J45991 Cough variant asthma: Secondary | ICD-10-CM | POA: Diagnosis not present

## 2024-07-29 MED ORDER — AIRSUPRA 90-80 MCG/ACT IN AERO
2.0000 | INHALATION_SPRAY | RESPIRATORY_TRACT | 11 refills | Status: DC | PRN
  Filled 2024-07-29: qty 10.7, 30d supply, fill #0

## 2024-07-29 MED ORDER — PREDNISONE 10 MG PO TABS
ORAL_TABLET | ORAL | 0 refills | Status: DC
  Filled 2024-07-29: qty 14, 30d supply, fill #0

## 2024-07-29 MED ORDER — ESOMEPRAZOLE MAGNESIUM 40 MG PO CPDR
40.0000 mg | DELAYED_RELEASE_CAPSULE | Freq: Every day | ORAL | 11 refills | Status: DC
  Filled 2024-07-29: qty 30, 30d supply, fill #0

## 2024-07-29 MED ORDER — BUDESONIDE-FORMOTEROL FUMARATE 160-4.5 MCG/ACT IN AERO
INHALATION_SPRAY | RESPIRATORY_TRACT | 12 refills | Status: DC
  Filled 2024-07-29: qty 10.2, 30d supply, fill #0
  Filled 2024-09-11: qty 10.2, 30d supply, fill #1

## 2024-07-29 MED ORDER — FAMOTIDINE 20 MG PO TABS
20.0000 mg | ORAL_TABLET | Freq: Every day | ORAL | 11 refills | Status: DC
  Filled 2024-07-29: qty 30, 30d supply, fill #0

## 2024-07-29 NOTE — Progress Notes (Unsigned)
 Jessica Martin, female    DOB: 13-Dec-1955    MRN: [id number]   Brief patient profile:  65  yowf  never smoker passive exp with freq URI's better p tonsilectomy  referred to pulmonary clinic in [facility]  02/21/2024 by Plotnikov MD  for chronic cough onset late  Fall 2024.  Allergy eval by [facility] when 20s for sneezing no cough pos dogs/ cats/ horses.   Trelegy started in Nov 2024 helped a lot but stopped it then changed to air supra.  Still has cats in bedroom/ no overt hb       History of Present Illness  02/21/2024  Pulmonary/ 1st office eval/ Jessica Martin / [place] [facility]  Chief Complaint  Patient presents with   Establish Care   Cough  Dyspnea:  none  Cough: no cough x 3 d on ppi several days on prednisone  > min white white mucus production  Sleep: flat bed one pillow  no longer waking up with cough  SABA use: minimal 02: none  Rec Plan A = Automatic = Always=    Symbicort  80 Take 2 puffs first thing in am and then another 2 puffs about 12 hours later.  Work on inhaler technique:  Plan B = Backup (to supplement plan A, not to replace it) Only use your air supra as a rescue medication      03/13/2024  f/u ov/Jessica Martin re: AB   maint on symbicort  80   Chief Complaint  Patient presents with   Follow-up    Coughing with white mucus she thought that she was getting better, she using the inhaler and using cough meds as needed   Dyspnea:  Not limited by breathing from desired activities   Cough: white phlegmy  Sleeping:  flat be one pillow does not flare hs or am  SABA use: none  02: none Rec Mucinex dm 1200 mg every 12 hours with glass of water as needed Pantoprazole  (protonix ) 40 mg   Take  30-60 min before first meal of the day and Pepcid (famotidine)  20 mg after supper until return to office -    GERD diet reviewed, bed blocks rec  Prednisone  10 mg take  4 each am x 2 days,   2 each am x 2 days,  1 each am x 2 days and stop  Plan A = Automatic = Always=    Symbicort  80 Take  2 puffs first thing in am and then another 2 puffs about 12 hours later.  Work on inhaler technique:  Plan B = Backup (to supplement plan A, not to replace it) Only use your air supra as a rescue medication  up 2 puffs  Cxr nl      07/29/2024 ACUTE  ov/Jessica Martin re: AB  maint on symbicort  80/daily clariton  and last prednisone  x sev weeks prior to OV   Chief Complaint  Patient presents with   Acute Visit    Cough with white sputum.   Dyspnea:  minimal  Cough: sporadic / white mucus  Sleeping: flat bed one pillow s  resp cc  SABA use: no albuterol   02:none    No obvious day to day or daytime variability or assoc  purulent sputum or mucus plugs or hemoptysis or cp or chest tightness, subjective wheeze or overt hb symptoms.    Also denies any obvious fluctuation of symptoms with weather or environmental changes or other aggravating or alleviating factors except as outlined above   No unusual exposure hx  or h/o childhood pna/ asthma or knowledge of premature birth.  Current Allergies, Complete Past Medical History, Past Surgical History, Family History, and Social History were reviewed in [employer] record.  ROS  The following are not active complaints unless bolded Hoarseness, sore throat, dysphagia, dental problems, itching, sneezing,  nasal congestion or discharge of excess mucus or purulent secretions, ear ache,   fever, chills, sweats, unintended wt loss or wt gain, classically pleuritic or exertional cp,  orthopnea pnd or arm/hand swelling  or leg swelling, presyncope, palpitations, abdominal pain, anorexia, nausea, vomiting, diarrhea  or change in bowel habits or change in bladder habits, change in stools or change in urine, dysuria, hematuria,  rash, arthralgias, visual complaints, headache, numbness, weakness or ataxia or problems with walking or coordination,  change in mood or  memory.        Current Meds  Medication Sig   ALPRAZolam  (XANAX ) 0.5 MG tablet  Take 1 tablet (0.5 mg total) by mouth 2 (two) times daily as needed.   azelastine (ASTELIN) 0.1 % nasal spray Place 1 spray into both nostrils 2 (two) times daily. Use in each nostril as directed   benzonatate  (TESSALON ) 200 MG capsule Take 1 capsule (200 mg total) by mouth 2 (two) times daily as needed for cough.   budesonide -formoterol  (SYMBICORT ) 80-4.5 MCG/ACT inhaler Inhale 2 puffs first thing in the morning.  Inhale second dose (2 puffs) about 12 hours later.   esomeprazole (NEXIUM) 20 MG capsule Take 20 mg by mouth daily at 12 noon.   HYDROcodone  bit-homatropine (HYCODAN) 5-1.5 MG/5ML syrup Take 5 mLs by mouth every 6 (six) hours as needed for cough.   loratadine  (CLARITIN ) 10 MG tablet Take 1 tablet (10 mg total) by mouth daily.   rosuvastatin  (CRESTOR ) 40 MG tablet Take 40 mg by mouth daily.   triamcinolone (NASACORT) 55 MCG/ACT AERO nasal inhaler Place 2 sprays into the nose daily.        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   GERD (gastroesophageal reflux disease)    Hx of adenomatous colonic polyps 03/29/2017   Mixed hyperlipidemia       Objective:      07/29/2024      141    03/13/24 146 lb (66.2 kg)  02/21/24 148 lb 3.2 oz (67.2 kg)  02/15/24 147 lb (66.7 kg)    Vital signs reviewed  07/29/2024  - Note at rest 02 sats  97% on RA   General appearance:  very pleasant amb wf / nasal tone / congested cough    HEENT : Oropharynx  clear         NECK :  without  apparent JVD/ palpable Nodes/TM    LUNGS: no acc muscle use,  Nl contour chest with minimal insp / exp rhonchi A and P bilaterally with  cough on  exp maneuvers   CV:  RRR  no s3 or murmur or increase in P2, and no edema   ABD:  soft and nontender   MS:     ext warm without deformities Or obvious joint restrictions  calf tenderness, cyanosis or clubbing    SKIN: warm and dry without lesions    NEURO:  alert, approp, nl sensorium with   no motor or cerebellar deficits apparent.       Assessment    Assessment & Plan Cough variant asthma Onset fall 2024 responsive to prednisone  -  02/21/2024  After extensive coaching inhaler device,  effectiveness =   90% > try symbicort  80 2bid and prn air supra  - Allergy screen 02/21/2024 >  Eos 0.2 /  IgE  5 - 03/13/2024  After extensive coaching inhaler device,  effectiveness =    75% with spacer > continue symbicort  80 and prn air supra - 03/13/2024 rx pred x 6 d, zmax  / max gerd rx and mucinex dm   >>> flared Oct 2025 better with pred then worse off so rec symbicort  incease to 160 and use air supra instead of albuterol  for flares with pred x 6 days as a back up    >>> max gerd rx when cough for any reason to prevent cyclical cough   >>> referred to allergy    Discussed in detail all the  indications, usual  risks and alternatives  relative to the benefits with patient who agrees to proceed with Rx as outlined.             Each maintenance medication was reviewed in detail including emphasizing most importantly the difference between maintenance and prns and under what circumstances the prns are to be triggered using an action plan format where appropriate.  Total time for H and P, chart review, counseling, reviewing hfa  device(s) and generating customized AVS unique to this office visit / same day charting = 31 min acute eval            AVS  Patient Instructions  Plan A = Automatic = Always=    symbicort  up to 160 Take 2 puffs first thing in am and then another 2 puffs about 12 hours later.    Work on inhaler technique:  relax and gently blow all the way out then take a nice smooth full deep breath back in, triggering the inhaler at same time you start breathing in.  Hold breath in for at least  5 seconds if you can. Blow out symbicort   thru nose. Rinse and gargle with water when done.  If mouth or throat bother you at all,  try brushing teeth/gums/tongue with arm and hammer  toothpaste/ make a slurry and gargle and spit out.     Plan B = Backup (to supplement plan A, not to replace it) Use your albuterol /budesonide   inhaler as a rescue medication to be used if you can't catch your breath by resting or slowing your pace  or doing a relaxed purse lip breathing pattern.  - The less you use it, the better it will work when you need it. - Ok to use the inhaler up to 2 puffs  every 4 hours if you must but call for appointment if use goes up over your usual need - Don't leave home without it !!  (think of it like the spare tire or starter fluid for your car)   Change nexium to  40 mg   Take  30-60 min before first meal of the day and Pepcid (famotidine)  20 mg after supper until no longer coughing x 2 weeks then ok to go back to the 20 mg nexim  - this is the best way to tell whether stomach acid is contributing to your problem.    Prednisone  10 mg take  4 each am x 2 days,   2 each am x 2 days,  1 each am x 2 days and stop   For cough/ congestion > mucinex or mucinex dm  up to maximum of  1200 mg every 12 hours as needed   My office will be contacting you by phone for referral to allergy   - if you don't hear back from my office within one week please call us  back or notify us  thru MyChart and we'll address it right away.   Follow up is as needed       Ozell America, MD 07/30/2024

## 2024-07-29 NOTE — Patient Instructions (Addendum)
 Plan A = Automatic = Always=    symbicort  up to 160 Take 2 puffs first thing in am and then another 2 puffs about 12 hours later.    Work on inhaler technique:  relax and gently blow all the way out then take a nice smooth full deep breath back in, triggering the inhaler at same time you start breathing in.  Hold breath in for at least  5 seconds if you can. Blow out symbicort   thru nose. Rinse and gargle with water when done.  If mouth or throat bother you at all,  try brushing teeth/gums/tongue with arm and hammer toothpaste/ make a slurry and gargle and spit out.     Plan B = Backup (to supplement plan A, not to replace it) Use your albuterol /budesonide   inhaler as a rescue medication to be used if you can't catch your breath by resting or slowing your pace  or doing a relaxed purse lip breathing pattern.  - The less you use it, the better it will work when you need it. - Ok to use the inhaler up to 2 puffs  every 4 hours if you must but call for appointment if use goes up over your usual need - Don't leave home without it !!  (think of it like the spare tire or starter fluid for your car)   Change nexium to  40 mg   Take  30-60 min before first meal of the day and Pepcid (famotidine)  20 mg after supper until no longer coughing x 2 weeks then ok to go back to the 20 mg nexim  - this is the best way to tell whether stomach acid is contributing to your problem.    Prednisone  10 mg take  4 each am x 2 days,   2 each am x 2 days,  1 each am x 2 days and stop   For cough/ congestion > mucinex or mucinex dm  up to maximum of  1200 mg every 12 hours as needed   My office will be contacting you by phone for referral to allergy   - if you don't hear back from my office within one week please call us  back or notify us  thru MyChart and we'll address it right away.   Follow up is as needed

## 2024-07-30 ENCOUNTER — Encounter: Payer: Self-pay | Admitting: Internal Medicine

## 2024-07-30 ENCOUNTER — Other Ambulatory Visit (HOSPITAL_COMMUNITY): Payer: Self-pay

## 2024-07-30 NOTE — Assessment & Plan Note (Addendum)
 Onset fall 2024 responsive to prednisone  - 02/21/2024  After extensive coaching inhaler device,  effectiveness =   90% > try symbicort  80 2bid and prn air supra  - Allergy screen 02/21/2024 >  Eos 0.2 /  IgE  5 - 03/13/2024  After extensive coaching inhaler device,  effectiveness =    75% with spacer > continue symbicort  80 and prn air supra - 03/13/2024 rx pred x 6 d, zmax  / max gerd rx and mucinex dm   >>> flared Oct 2025 better with pred then worse off so rec symbicort  incease to 160 and use air supra instead of albuterol  for flares with pred x 6 days as a back up    >>> max gerd rx when cough for any reason to prevent cyclical cough   >>> referred to allergy    Discussed in detail all the  indications, usual  risks and alternatives  relative to the benefits with patient who agrees to proceed with Rx as outlined.             Each maintenance medication was reviewed in detail including emphasizing most importantly the difference between maintenance and prns and under what circumstances the prns are to be triggered using an action plan format where appropriate.  Total time for H and P, chart review, counseling, reviewing hfa  device(s) and generating customized AVS unique to this office visit / same day charting = 31 min acute eval

## 2024-07-31 ENCOUNTER — Ambulatory Visit
Admission: RE | Admit: 2024-07-31 | Discharge: 2024-07-31 | Disposition: A | Discharge status: Home / Self Care | Source: Ambulatory Visit | Attending: Obstetrics & Gynecology | Admitting: Obstetrics & Gynecology

## 2024-07-31 DIAGNOSIS — Z1231 Encounter for screening mammogram for malignant neoplasm of breast: Secondary | ICD-10-CM

## 2024-08-06 ENCOUNTER — Other Ambulatory Visit: Payer: Self-pay

## 2024-08-06 ENCOUNTER — Other Ambulatory Visit (HOSPITAL_COMMUNITY): Payer: Self-pay

## 2024-08-06 ENCOUNTER — Other Ambulatory Visit (HOSPITAL_BASED_OUTPATIENT_CLINIC_OR_DEPARTMENT_OTHER): Payer: Self-pay

## 2024-08-06 ENCOUNTER — Ambulatory Visit: Admitting: Allergy and Immunology

## 2024-08-06 ENCOUNTER — Encounter: Payer: Self-pay | Admitting: Allergy and Immunology

## 2024-08-06 VITALS — BP 130/82 | HR 82 | Temp 98.9°F | Resp 18 | Ht 65.75 in | Wt 138.4 lb

## 2024-08-06 DIAGNOSIS — R430 Anosmia: Secondary | ICD-10-CM

## 2024-08-06 DIAGNOSIS — J324 Chronic pansinusitis: Secondary | ICD-10-CM

## 2024-08-06 DIAGNOSIS — J4550 Severe persistent asthma, uncomplicated: Secondary | ICD-10-CM

## 2024-08-06 DIAGNOSIS — K219 Gastro-esophageal reflux disease without esophagitis: Secondary | ICD-10-CM | POA: Diagnosis not present

## 2024-08-06 DIAGNOSIS — J3089 Other allergic rhinitis: Secondary | ICD-10-CM | POA: Diagnosis not present

## 2024-08-06 MED ORDER — TRIAMCINOLONE ACETONIDE 55 MCG/ACT NA AERO
2.0000 | INHALATION_SPRAY | Freq: Every day | NASAL | 2 refills | Status: DC
  Filled 2024-08-06 (×2): qty 16.9, 30d supply, fill #0
  Filled 2024-09-09: qty 16.9, 30d supply, fill #1

## 2024-08-06 MED ORDER — BREZTRI AEROSPHERE 160-9-4.8 MCG/ACT IN AERO
2.0000 | INHALATION_SPRAY | Freq: Two times a day (BID) | RESPIRATORY_TRACT | 1 refills | Status: DC
  Filled 2024-08-06: qty 10.7, 30d supply, fill #0
  Filled 2024-09-09: qty 10.7, 30d supply, fill #1

## 2024-08-06 MED ORDER — ESOMEPRAZOLE MAGNESIUM 40 MG PO CPDR
40.0000 mg | DELAYED_RELEASE_CAPSULE | Freq: Every day | ORAL | 11 refills | Status: DC
  Filled 2024-08-06 – 2024-08-24 (×3): qty 30, 30d supply, fill #0
  Filled 2024-09-09: qty 30, 30d supply, fill #1

## 2024-08-06 MED ORDER — AIRSUPRA 90-80 MCG/ACT IN AERO
2.0000 | INHALATION_SPRAY | RESPIRATORY_TRACT | 11 refills | Status: DC | PRN
  Filled 2024-08-06 (×2): qty 10.7, 30d supply, fill #0

## 2024-08-06 NOTE — Patient Instructions (Signed)
  1. Treat and prevent inflammation of airway:   A. Breztri - 2 inhalations 2 times per day w/ spacer (empty lungs)  B. Nasacort - 1 spray each nostril 2 times per day  2. Treat and prevent reflux induced inflammation of airway:   A. Nexium 40 mg - 1 tablet 2 times per day  B. Minimize caffeine consumption   3. If needed:   A. Airsupra  - 2 inhalations every 4-6 hours  B. OTC antihistamine  C. OTC nasal saline  4. Obtain blood - CBC w/d, area 2 aeroallergen IgE profile, IgA/G/M  5. Obtain sinus CT scan for chronic sinusitis  6. Biologic agent???  7. Immunotherapy???  8. Return to clinic in 4 weeks or earlier if problem

## 2024-08-06 NOTE — Progress Notes (Unsigned)
 [place] - [place] - [place] - [place] - [place]   Dear Darlean,  Thank you for referring Jessica Martin to the [facility] [place]  on 08/06/2024.   Below is a summation of this patient's evaluation and recommendations.  Thank you for your referral. I will keep you informed about this patient's response to treatment.   If you have any questions please do not hesitate to contact me.   Sincerely,  Camellia DOROTHA Denis, MD Allergy / Immunology [facility] [place]    ______________________________________________________________________    NEW PATIENT NOTE  Referring Provider: Darlean Ozell NOVAK, MD Primary Provider: Garald Karlynn GAILS, MD Date of office visit: 08/06/2024    Subjective:   Chief Complaint:  Jessica Martin (DOB: October 11, 1955) is a 68 y.o. female who presents to the clinic on 08/06/2024 with a chief complaint of Cough (Referred by Dr everardo has been coughing about a year. Has tried different medication by nothing has worked.) .     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  She has been coughing for over a year.  She calls this a phlegmy cough.  There is not really any associated shortness of breath or chest tightness.  There is not really any associated throat clearing or a glob in her throat or raspy voice.  There is not a limitation on ability to exercise although she does not really exercise any significant degree.  There is no associated posttussive emesis.  When she uses a short acting bronchodilator does appear to help her cough.  She has lots of congestion in her head.  She has some decreased ability to smell.  She has tried Astepro which has not helped her.  She has been on Nasacort for the past 2 weeks which may help her somewhat.  She has seen pulmonology, Dr. Wert, and has been treated with Symbicort  for a year which she does think helps her somewhat.  She has also  been treated with a proton pump inhibitor and currently is using Nexium every day.  She has been treated with systemic steroids a few times and is currently using 10 mg of prednisone  over the course of the past week.  Whenever she takes prednisone  she definitely does a lot better regarding both her head and chest.  She has a history of cat allergy documented decades ago and she does have cats located inside the household.  Past Medical History:  Diagnosis Date  . 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 GERD (gastroesophageal reflux disease)   . Hx of adenomatous colonic polyps 03/29/2017  . Mixed hyperlipidemia     Past Surgical History:  Procedure Laterality Date  . MELANOMA EXCISION  2006   right neck  . TONSILLECTOMY AND ADENOIDECTOMY  age 40    Allergies as of 08/06/2024       Reactions   Pantoprazole  Anaphylaxis        Medication List    Airsupra  90-80 MCG/ACT Aero Generic drug: Albuterol -Budesonide  Inhale 2 puffs into the lungs every 4 (four) hours as needed.   ALPRAZolam  0.5 MG tablet Commonly known as: XANAX  Take 1 tablet (0.5 mg total) by mouth 2 (two) times daily as needed.   azelastine 0.1 % nasal spray Commonly known as: ASTELIN Place 1 spray into both nostrils 2 (two) times daily. Use in each nostril as directed   benzonatate  200 MG  capsule Commonly known as: TESSALON  Take 1 capsule (200 mg total) by mouth 2 (two) times daily as needed for cough.   budesonide -formoterol  160-4.5 MCG/ACT inhaler Commonly known as: Symbicort  Take 2 puffs first thing in am and then another 2 puffs about 12 hours later.   esomeprazole 40 MG capsule Commonly known as: NexIUM Take 1 capsule (40 mg total) by mouth daily at 12 noon.   famotidine 20 MG tablet Commonly known as: Pepcid Take 1 tablet (20 mg total) by mouth daily after supper.   HYDROcodone  bit-homatropine 5-1.5 MG/5ML syrup Commonly known as: HYCODAN Take 5 mLs by mouth every 6  (six) hours as needed for cough.   loratadine  10 MG tablet Commonly known as: CLARITIN  Take 1 tablet (10 mg total) by mouth daily.   predniSONE  10 MG tablet Commonly known as: DELTASONE  Take 4 tablets by mouth each morning for 2 days THEN take 2 tablets each morning for 2 days THEN take 1 tablet each morning for 2 days   rosuvastatin  40 MG tablet Commonly known as: CRESTOR  Take 40 mg by mouth daily.   triamcinolone 55 MCG/ACT Aero nasal inhaler Commonly known as: NASACORT Place 2 sprays into the nose daily.    Review of systems negative except as noted in HPI / PMHx or noted below:  Review of Systems  Constitutional: Negative.   HENT: Negative.    Eyes: Negative.   Respiratory: Negative.    Cardiovascular: Negative.   Gastrointestinal: Negative.   Genitourinary: Negative.   Musculoskeletal: Negative.   Skin: Negative.   Neurological: Negative.   Endo/Heme/Allergies: Negative.   Psychiatric/Behavioral: Negative.      Family History  Problem Relation Age of Onset  . Lung cancer Mother   . Colon cancer Other 70  . Colon cancer Brother   . Esophageal cancer Neg Hx   . Rectal cancer Neg Hx   . Stomach cancer Neg Hx     Social History   Socioeconomic History  . Marital status: Single    Spouse name: Not on file  . Number of children: Not on file  . Years of education: Not on file  . Highest education level: Professional school degree (e.g., MD, DDS, DVM, JD)  Occupational History  . Not on file  Tobacco Use  . Smoking status: Never  . Smokeless tobacco: Never  Vaping Use  . Vaping status: Never Used  Substance and Sexual Activity  . Alcohol use: Yes    Comment: social  . Drug use: No  . Sexual activity: Not Currently    Birth control/protection: Post-menopausal  Other Topics Concern  . Not on file  Social History Narrative  . Not on file   Social Drivers of Health   Financial Resource Strain: Low Risk  (02/15/2024)   Overall Financial Resource  Strain (CARDIA)   . Difficulty of Paying Living Expenses: Not hard at all  Food Insecurity: No Food Insecurity (02/15/2024)   Hunger Vital Sign   . Worried About Programme Researcher, Broadcasting/film/video in the Last Year: Never true   . Ran Out of Food in the Last Year: Never true  Transportation Needs: No Transportation Needs (02/15/2024)   PRAPARE - Transportation   . Lack of Transportation (Medical): No   . Lack of Transportation (Non-Medical): No  Physical Activity: Sufficiently Active (02/15/2024)   Exercise Vital Sign   . Days of Exercise per Week: 6 days   . Minutes of Exercise per Session: 40 min  Stress: No Stress Concern Present (  02/15/2024)   [employer] of Occupational Health - Occupational Stress Questionnaire   . Feeling of Stress : Only a little  Social Connections: Moderately Isolated (02/15/2024)   Social Connection and Isolation Panel   . Frequency of Communication with Friends and Family: Twice a week   . Frequency of Social Gatherings with Friends and Family: Once a week   . Attends Religious Services: 1 to 4 times per year   . Active Member of Clubs or Organizations: No   . Attends Banker Meetings: Not on file   . Marital Status: Never married  Catering Manager Violence: Not on Scientist, Research (life Sciences) and Social history  Lives in a house with a dry environment, cats located inside the household, carpet in the bedroom, no plastic on the bed, no plastic on the pillow, and no smoking ongoing with inside the household.  She is a internal medicine physician.  Objective:   Vitals:   08/06/24 0839 08/06/24 0910  BP: (!) 140/80 130/82  Pulse: 82   Resp: 18   Temp: 98.9 F (37.2 C)   SpO2: 99%    Height: 5' 5.75 (167 cm) Weight: 138 lb 6.4 oz (62.8 kg)  Physical Exam Constitutional:      Appearance: She is not diaphoretic.  HENT:     Head: Normocephalic.     Right Ear: Tympanic membrane, ear canal and external ear normal.     Left Ear: Tympanic membrane, ear  canal and external ear normal.     Nose: Nose normal. No mucosal edema or rhinorrhea.     Mouth/Throat:     Pharynx: Uvula midline. No oropharyngeal exudate.  Eyes:     Conjunctiva/sclera: Conjunctivae normal.  Neck:     Thyroid: No thyromegaly.     Trachea: Trachea normal. No tracheal tenderness or tracheal deviation.  Cardiovascular:     Rate and Rhythm: Normal rate and regular rhythm.     Heart sounds: Normal heart sounds, S1 normal and S2 normal. No murmur heard. Pulmonary:     Effort: No respiratory distress.     Breath sounds: Normal breath sounds. No stridor. No wheezing or rales.  Lymphadenopathy:     Head:     Right side of head: No tonsillar adenopathy.     Left side of head: No tonsillar adenopathy.     Cervical: No cervical adenopathy.  Skin:    Findings: No erythema or rash.     Nails: There is no clubbing.  Neurological:     Mental Status: She is alert.     Diagnostics: Allergy skin tests were not performed.   Spirometry was performed and demonstrated an FEV1 of 2.32 @ 102 % of predicted. FEV1/FVC = 0.70  Assessment and Plan:    1. Not well controlled severe persistent asthma (HCC)   2. Perennial allergic rhinitis   3. Anosmia   4. Chronic pansinusitis   5. LPRD (laryngopharyngeal reflux disease)     Patient Instructions   1. Treat and prevent inflammation of airway:   A. Breztri - 2 inhalations 2 times per day w/ spacer (empty lungs)  B. Nasacort - 1 spray each nostril 2 times per day  2. Treat and prevent reflux induced inflammation of airway:   A. Nexium 40 mg - 1 tablet 2 times per day  B. Minimize caffeine consumption   3. If needed:   A. Airsupra  - 2 inhalations every 4-6 hours  B. OTC antihistamine  C. OTC nasal saline  4. Obtain blood - CBC w/d, area 2 aeroallergen IgE profile  5. Obtain sinus CT scan for chronic sinusitis  6. Biologic agent???  7. Immunotherapy???  8. Return to clinic in 4 weeks or earlier if problem   Camellia DOROTHA Denis, MD Allergy / Immunology [facility] [place] 

## 2024-08-07 ENCOUNTER — Encounter: Payer: Self-pay | Admitting: Allergy and Immunology

## 2024-08-07 ENCOUNTER — Other Ambulatory Visit (HOSPITAL_COMMUNITY): Payer: Self-pay

## 2024-08-09 LAB — CBC WITH DIFFERENTIAL/PLATELET
Basophils Absolute: 0 x10E3/uL (ref 0.0–0.2)
Basos: 1 %
EOS (ABSOLUTE): 0.1 x10E3/uL (ref 0.0–0.4)
Eos: 1 %
Hematocrit: 46.8 % — ABNORMAL HIGH (ref 34.0–46.6)
Hemoglobin: 15.3 g/dL (ref 11.1–15.9)
Immature Grans (Abs): 0 x10E3/uL (ref 0.0–0.1)
Immature Granulocytes: 0 %
Lymphocytes Absolute: 0.9 x10E3/uL (ref 0.7–3.1)
Lymphs: 16 %
MCH: 29.9 pg (ref 26.6–33.0)
MCHC: 32.7 g/dL (ref 31.5–35.7)
MCV: 92 fL (ref 79–97)
Monocytes Absolute: 0.4 x10E3/uL (ref 0.1–0.9)
Monocytes: 7 %
Neutrophils Absolute: 4.5 x10E3/uL (ref 1.4–7.0)
Neutrophils: 74 %
Platelets: 316 x10E3/uL (ref 150–450)
RBC: 5.11 x10E6/uL (ref 3.77–5.28)
RDW: 12.1 % (ref 11.7–15.4)
WBC: 5.9 x10E3/uL (ref 3.4–10.8)

## 2024-08-09 LAB — ALLERGENS W/TOTAL IGE AREA 2
Alternaria Alternata IgE: <0.1 kU/L
Aspergillus Fumigatus IgE: <0.1 kU/L
Bermuda Grass IgE: <0.1 kU/L
Cat Dander IgE: <0.1 kU/L
Cedar, Mountain IgE: <0.1 kU/L
Cladosporium Herbarum IgE: <0.1 kU/L
Cockroach, German IgE: <0.1 kU/L
Common Silver Birch IgE: <0.1 kU/L
Cottonwood IgE: <0.1 kU/L
D Farinae IgE: <0.1 kU/L
D Pteronyssinus IgE: <0.1 kU/L
Dog Dander IgE: <0.1 kU/L
Elm, American IgE: <0.1 kU/L
IgE (Immunoglobulin E), Serum: <2 [IU]/mL — AB (ref 6–495)
Johnson Grass IgE: <0.1 kU/L
Maple/Box Elder IgE: <0.1 kU/L
Mouse Urine IgE: <0.1 kU/L
Oak, White IgE: <0.1 kU/L
Pecan, Hickory IgE: <0.1 kU/L
Penicillium Chrysogen IgE: <0.1 kU/L
Pigweed, Rough IgE: <0.1 kU/L
Ragweed, Short IgE: <0.1 kU/L
Sheep Sorrel IgE Qn: <0.1 kU/L
Timothy Grass IgE: <0.1 kU/L
White Mulberry IgE: <0.1 kU/L

## 2024-08-12 ENCOUNTER — Telehealth: Payer: Self-pay | Admitting: *Deleted

## 2024-08-12 NOTE — Addendum Note (Signed)
 Addended by: Gregg Holster E on: 08/12/2024 12:22 PM   Modules accepted: Orders

## 2024-08-12 NOTE — Telephone Encounter (Signed)
 Received fax from [facility] for add on test IgA, IgG, IgM, needing additional dx codes. Done and faxed back.

## 2024-08-13 LAB — SPECIMEN STATUS REPORT

## 2024-08-13 LAB — IGG, IGA, IGM
IgG (Immunoglobin G), Serum: 982 mg/dL (ref 586–1602)
IgM (Immunoglobulin M), Srm: 88 mg/dL (ref 26–217)
Immunoglobulin A, (IgA) QN, Serum: 111 mg/dL (ref 87–352)

## 2024-08-14 ENCOUNTER — Ambulatory Visit: Payer: Self-pay | Admitting: Allergy and Immunology

## 2024-08-14 ENCOUNTER — Other Ambulatory Visit: Payer: Self-pay | Admitting: Allergy and Immunology

## 2024-08-14 DIAGNOSIS — J324 Chronic pansinusitis: Secondary | ICD-10-CM

## 2024-08-21 ENCOUNTER — Ambulatory Visit (HOSPITAL_COMMUNITY)
Admission: RE | Admit: 2024-08-21 | Discharge: 2024-08-21 | Disposition: A | Discharge status: Home / Self Care | Source: Ambulatory Visit | Attending: Allergy and Immunology | Admitting: Allergy and Immunology

## 2024-08-21 DIAGNOSIS — J324 Chronic pansinusitis: Secondary | ICD-10-CM | POA: Diagnosis present

## 2024-08-21 DIAGNOSIS — J0130 Acute sphenoidal sinusitis, unspecified: Secondary | ICD-10-CM | POA: Diagnosis not present

## 2024-08-24 ENCOUNTER — Other Ambulatory Visit (HOSPITAL_COMMUNITY): Payer: Self-pay

## 2024-08-26 ENCOUNTER — Other Ambulatory Visit (HOSPITAL_COMMUNITY): Payer: Self-pay

## 2024-08-26 MED ORDER — AMOXICILLIN-POT CLAVULANATE 875-125 MG PO TABS
ORAL_TABLET | ORAL | 0 refills | Status: DC
  Filled 2024-08-26: qty 40, 20d supply, fill #0

## 2024-08-30 ENCOUNTER — Encounter (INDEPENDENT_AMBULATORY_CARE_PROVIDER_SITE_OTHER): Payer: Self-pay

## 2024-09-03 ENCOUNTER — Ambulatory Visit: Admitting: Allergy and Immunology

## 2024-09-09 ENCOUNTER — Other Ambulatory Visit (HOSPITAL_COMMUNITY): Payer: Self-pay

## 2024-09-10 ENCOUNTER — Ambulatory Visit: Admitting: Allergy and Immunology

## 2024-09-10 ENCOUNTER — Other Ambulatory Visit: Payer: Self-pay

## 2024-09-10 DIAGNOSIS — J3089 Other allergic rhinitis: Secondary | ICD-10-CM | POA: Diagnosis not present

## 2024-09-10 DIAGNOSIS — J4550 Severe persistent asthma, uncomplicated: Secondary | ICD-10-CM

## 2024-09-10 DIAGNOSIS — K219 Gastro-esophageal reflux disease without esophagitis: Secondary | ICD-10-CM | POA: Diagnosis not present

## 2024-09-10 MED ORDER — ESOMEPRAZOLE MAGNESIUM 40 MG PO CPDR
40.0000 mg | DELAYED_RELEASE_CAPSULE | Freq: Every day | ORAL | 3 refills | Status: AC
  Filled 2024-10-06 – 2024-10-10 (×2): qty 30, 30d supply, fill #0
  Filled 2024-11-05: qty 30, 30d supply, fill #1

## 2024-09-10 MED ORDER — AIRSUPRA 90-80 MCG/ACT IN AERO: 2.0000 | INHALATION_SPRAY | RESPIRATORY_TRACT | 3 refills | Status: AC | PRN

## 2024-09-10 MED ORDER — TRIAMCINOLONE ACETONIDE 55 MCG/ACT NA AERO: 2.0000 | INHALATION_SPRAY | Freq: Every day | NASAL | 3 refills | Status: AC

## 2024-09-10 MED ORDER — BREZTRI AEROSPHERE 160-9-4.8 MCG/ACT IN AERO: 2.0000 | INHALATION_SPRAY | Freq: Two times a day (BID) | RESPIRATORY_TRACT | 3 refills | Status: DC

## 2024-09-10 NOTE — Progress Notes (Unsigned)
 [place] - [place] - [place] - [place] - [place]   Follow-up Note  Referring Provider: Plotnikov, Karlynn GAILS, MD Primary Provider: Garald Karlynn GAILS, MD Date of Office Visit: 09/10/2024  Subjective:   Jessica Martin (DOB: 28-Oct-1955) is a 68 y.o. 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  I last saw her in this clinic during her initial evaluation of 06 August 2024.  She is somewhat better.  She has less cough.  She has less nasal congestion.  She can smell better.  She has been consistently using therapy directed against reflux and inflammation and chronic sinusitis.  Allergies as of 09/10/2024       Reactions   Pantoprazole  Anaphylaxis        Medication List    Airsupra  90-80 MCG/ACT Aero Generic drug: Albuterol -Budesonide  Inhale 2 puffs into the lungs every 4 (four) hours as needed.   ALPRAZolam  0.5 MG tablet Commonly known as: XANAX  Take 1 tablet (0.5 mg total) by mouth 2 (two) times daily as needed.   amoxicillin -clavulanate 875-125 MG tablet Commonly known as: AUGMENTIN  Take one tablet by mouth twice daily for 20 days.   azelastine 0.1 % nasal spray Commonly known as: ASTELIN Place 1 spray into both nostrils 2 (two) times daily. Use in each nostril as directed   benzonatate  200 MG capsule Commonly known as: TESSALON  Take 1 capsule (200 mg total) by mouth 2 (two) times daily as needed for cough.   Breztri  Aerosphere 160-9-4.8 MCG/ACT Aero inhaler Generic drug: budesonide -glycopyrrolate -formoterol  Inhale 2 puffs into the lungs in the morning and at bedtime.   budesonide -formoterol  160-4.5 MCG/ACT inhaler Commonly known as: Symbicort  Take 2 puffs first thing in am and then another 2 puffs about 12 hours later.   esomeprazole  40  MG capsule Commonly known as: NexIUM  Take 1 capsule (40 mg total) by mouth daily at 12 noon.   famotidine  20 MG tablet Commonly known as: Pepcid  Take 1 tablet (20 mg total) by mouth daily after supper.   HYDROcodone  bit-homatropine 5-1.5 MG/5ML syrup Commonly known as: HYCODAN Take 5 mLs by mouth every 6 (six) hours as needed for cough.   loratadine  10 MG tablet Commonly known as: CLARITIN  Take 1 tablet (10 mg total) by mouth daily.   Nasacort  Allergy 24HR 55 MCG/ACT Aero nasal inhaler Generic drug: triamcinolone  Place 2 sprays into the nose daily.   predniSONE  10 MG tablet Commonly known as: DELTASONE  Take 4 tablets by mouth each morning for 2 days THEN take 2 tablets each morning for 2 days THEN take 1 tablet each morning for 2 days   rosuvastatin  40 MG tablet Commonly known as: CRESTOR  Take 40 mg by mouth daily.    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   GERD (gastroesophageal reflux disease)    Hx of adenomatous colonic polyps 03/29/2017   Mixed hyperlipidemia     Past Surgical History:  Procedure Laterality Date   MELANOMA EXCISION  2006   right neck   TONSILLECTOMY AND ADENOIDECTOMY  age 28    Review of systems negative except as noted in HPI / PMHx or noted below:  Review of Systems  Constitutional: Negative.   HENT: Negative.    Eyes: Negative.  Respiratory: Negative.    Cardiovascular: Negative.   Gastrointestinal: Negative.   Genitourinary: Negative.   Musculoskeletal: Negative.   Skin: Negative.   Neurological: Negative.   Endo/Heme/Allergies: Negative.   Psychiatric/Behavioral: Negative.       Objective:   Vitals:   09/10/24 1340  BP: 130/82  Pulse: 79  Resp: 20  Temp: 97.8 F (36.6 C)  SpO2: 98%   Height: 5' 5.75 (167 cm)  Weight: 141 lb 4.8 oz (64.1 kg)   Physical Exam Constitutional:      Appearance: She is not diaphoretic.  HENT:     Head: Normocephalic.      Right Ear: Tympanic membrane, ear canal and external ear normal.     Left Ear: Tympanic membrane, ear canal and external ear normal.     Nose: Nose normal. No mucosal edema or rhinorrhea.     Mouth/Throat:     Pharynx: Uvula midline. No oropharyngeal exudate.  Eyes:     Conjunctiva/sclera: Conjunctivae normal.  Neck:     Thyroid: No thyromegaly.     Trachea: Trachea normal. No tracheal tenderness or tracheal deviation.  Cardiovascular:     Rate and Rhythm: Normal rate and regular rhythm.     Heart sounds: Normal heart sounds, S1 normal and S2 normal. No murmur heard. Pulmonary:     Effort: No respiratory distress.     Breath sounds: Normal breath sounds. No stridor. No wheezing or rales.  Lymphadenopathy:     Head:     Right side of head: No tonsillar adenopathy.     Left side of head: No tonsillar adenopathy.     Cervical: No cervical adenopathy.  Skin:    Findings: No erythema or rash.     Nails: There is no clubbing.  Neurological:     Mental Status: She is alert.     Diagnostics:   Results of a sinus CT scan obtained 21 August 2024 identifies the following:  Clear. Patent frontoethmoidal recesses.   MAXILLARY: Mild mucosal thickening is present along the floor of the maxillary sinus bilaterally. Ostiomeatal complex is patent bilaterally. No fluid levels are present.   ETHMOID: Scattered mucosal thickening is present throughout the ethmoid air cells. The ethmoid roofs are symmetric. Extensive bilateral pneumatization is present superior to the anterior ethmoid notches. The lamina papyracea are intact. The optic nerve canals are intact.   SPHENOID: Fluid levels are present in the sphenoid sinuses bilaterally with 50% opacification. Keros type 3 sellar pattern of the sphenoid sinuses. No Onodi cell. The sphenoethmoid recesses are patent. The carotid canals are intact. Atherosclerotic calcifications are present in the cavernous carotid arteries bilaterally.  No hyperdense vessel is present.   NASAL CAVITY: Patent. Nasal septum is midline.   OTHER: The mastoid air cells and middle ear cavities are well aerated. No acute soft tissue abnormality.  Results of blood tests obtained for November 2025 identifies WBC 5.9, absolute eosinophil 100, absolute lymphocyte 900, hemoglobin 15.3, platelet 316, IgE less than 2 KU/L, no IgE antibodies directed against a series of aeroallergens on an area to aeroallergen IgE profile, IgG 982 MGs/DL, IgM 88 MGs/DL, IgA 889 mg/DL  Assessment and Plan:   1. Not well controlled severe persistent asthma (HCC)   2. Perennial allergic rhinitis   3. LPRD (laryngopharyngeal reflux disease)    1. Treat and prevent inflammation of airway:   A. Breztri  - 2 inhalations 1-2 times per day w/ spacer (empty lungs)  B. Nasacort  - 1 spray each nostril 2 times  per day  2. Treat and prevent reflux induced inflammation of airway:   A. Nexium  40 mg - 1 tablet 1-2 times per day  B. Minimize caffeine consumption   3. Treat infection:   A. Complete 20 days of Augmentin   B. Visit with ENT  4. If needed:   A. Airsupra  - 2 inhalations every 4-6 hours  B. OTC antihistamine  C. OTC nasal saline  5. Return to clinic in 12 weeks or earlier if problem. Taper medications???  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  She needs to visit with ENT for rhinoscopy to take a look at her larynx to see if she still has evidence of reflux affecting her airway and also to look at her sinus ostia to see if there is purulent drainage.  She has 8 more days left on her Augmentin  for a total of 20 days and hopefully at the end of this treatment she will be much better.  I will see her back in this clinic in 12 weeks and if she does well then we will begin to taper some of her medications.  Camellia Denis,  MD Allergy / Immunology [facility]

## 2024-09-10 NOTE — Patient Instructions (Addendum)
  1. Treat and prevent inflammation of airway:   A. Breztri  - 2 inhalations 1-2 times per day w/ spacer (empty lungs)  B. Nasacort  - 1 spray each nostril 2 times per day  2. Treat and prevent reflux induced inflammation of airway:   A. Nexium  40 mg - 1 tablet 1-2 times per day  B. Minimize caffeine consumption   3. Treat infection:   A. Complete 20 days of Augmentin   B. Visit with ENT  4. If needed:   A. Airsupra  - 2 inhalations every 4-6 hours  B. OTC antihistamine  C. OTC nasal saline  5. Return to clinic in 12 weeks or earlier if problem. Taper medications???

## 2024-09-11 ENCOUNTER — Encounter: Payer: Self-pay | Admitting: Allergy and Immunology

## 2024-09-11 ENCOUNTER — Other Ambulatory Visit: Payer: Self-pay

## 2024-09-11 ENCOUNTER — Other Ambulatory Visit (HOSPITAL_COMMUNITY): Payer: Self-pay

## 2024-09-16 ENCOUNTER — Ambulatory Visit (HOSPITAL_BASED_OUTPATIENT_CLINIC_OR_DEPARTMENT_OTHER): Admitting: Obstetrics & Gynecology

## 2024-09-18 NOTE — Progress Notes (Unsigned)
° °  ANNUAL EXAM Patient name: Jessica Martin MRN [id number]  Date of birth: 03/14/1956 Chief Complaint:   No chief complaint on file.  History of Present Illness:   Jessica Martin is a 68 y.o. G0P0000 Caucasian female being seen today for a routine annual exam.  Current complaints: ***  Patient's last menstrual period was 10/03/2001.   The pregnancy intention screening data noted above was reviewed. Potential methods of contraception were discussed. The patient elected to proceed with No data recorded.   Last pap 03/19/2018. Results were: NILM w/ HRHPV negative. H/O abnormal pap: {yes/yes***/no:23866} Last mammogram: 07/31/2024. Results were: normal. Family h/o breast cancer: {yes***/no:23838} Last colonoscopy: 03/22/2017. Results were: abnormal polyp, diverticula. Family h/o colorectal cancer: {yes***/no:23838}     02/15/2024    4:00 PM 08/30/2023    3:18 PM 09/01/2022   10:51 AM 07/05/2021    2:57 PM  Depression screen PHQ 2/9  Decreased Interest 0 0 0 0  Down, Depressed, Hopeless 0 0 0 0  PHQ - 2 Score 0 0 0 0         No data to display           Review of Systems:   Pertinent items are noted in HPI Denies any headaches, blurred vision, fatigue, shortness of breath, chest pain, abdominal pain, abnormal vaginal discharge/itching/odor/irritation, problems with periods, bowel movements, urination, or intercourse unless otherwise stated above. Pertinent History Reviewed:  Reviewed past medical,surgical, social and family history.  Reviewed problem list, medications and allergies. Physical Assessment:  There were no vitals filed for this visit.There is no height or weight on file to calculate BMI.        Physical Examination:   General appearance - well appearing, and in no distress  Mental status - alert, oriented to person, place, and time  Psych:  She has a normal mood and affect  Skin - warm and dry, normal color, no suspicious lesions noted  Chest - effort normal, all  lung fields clear to auscultation bilaterally  Heart - normal rate and regular rhythm  Neck:  midline trachea, no thyromegaly or nodules  Breasts - breasts appear normal, no suspicious masses, no skin or nipple changes or  axillary nodes  Abdomen - soft, nontender, nondistended, no masses or organomegaly  Pelvic - VULVA: normal appearing vulva with no masses, tenderness or lesions  VAGINA: normal appearing vagina with normal color and discharge, no lesions  CERVIX: normal appearing cervix without discharge or lesions, no CMT  Thin prep pap is {Desc; done/not:10129} *** HR HPV cotesting  UTERUS: uterus is felt to be normal size, shape, consistency and nontender   ADNEXA: No adnexal masses or tenderness noted.  Rectal - normal rectal, good sphincter tone, no masses felt. Hemoccult: ***  Extremities:  No swelling or varicosities noted  Chaperone present for exam  No results found for this or any previous visit (from the past 24 hours).  Assessment & Plan:  1) Well-Woman Exam  2) ***  Labs/procedures today: ***  Mammogram: {Mammo f/u:25212::@ 70yo}, or sooner if problems Colonoscopy: {TCS f/u:25213::@ 70yo}, or sooner if problems  No orders of the defined types were placed in this encounter.   Meds: No orders of the defined types were placed in this encounter.   Follow-up: No follow-ups on file.  Sprague, Kaitlyn E, RN 09/18/2024 8:11 AM

## 2024-09-19 ENCOUNTER — Other Ambulatory Visit: Payer: Self-pay

## 2024-09-19 ENCOUNTER — Other Ambulatory Visit (HOSPITAL_COMMUNITY): Payer: Self-pay

## 2024-09-19 ENCOUNTER — Encounter (HOSPITAL_BASED_OUTPATIENT_CLINIC_OR_DEPARTMENT_OTHER): Payer: Self-pay | Admitting: Obstetrics & Gynecology

## 2024-09-19 ENCOUNTER — Ambulatory Visit (INDEPENDENT_AMBULATORY_CARE_PROVIDER_SITE_OTHER): Admitting: Obstetrics & Gynecology

## 2024-09-19 VITALS — BP 150/78 | HR 110 | Ht 67.0 in | Wt 137.0 lb

## 2024-09-19 DIAGNOSIS — Z860101 Personal history of adenomatous and serrated colon polyps: Secondary | ICD-10-CM

## 2024-09-19 DIAGNOSIS — E2839 Other primary ovarian failure: Secondary | ICD-10-CM

## 2024-09-19 DIAGNOSIS — F419 Anxiety disorder, unspecified: Secondary | ICD-10-CM

## 2024-09-19 DIAGNOSIS — Z01419 Encounter for gynecological examination (general) (routine) without abnormal findings: Secondary | ICD-10-CM

## 2024-09-19 DIAGNOSIS — Z1331 Encounter for screening for depression: Secondary | ICD-10-CM

## 2024-09-19 DIAGNOSIS — I2583 Coronary atherosclerosis due to lipid rich plaque: Secondary | ICD-10-CM

## 2024-09-19 MED ORDER — ALPRAZOLAM 0.5 MG PO TABS
0.5000 mg | ORAL_TABLET | Freq: Two times a day (BID) | ORAL | 0 refills | Status: AC | PRN
  Filled 2024-09-19: qty 60, 30d supply, fill #0

## 2024-09-20 ENCOUNTER — Other Ambulatory Visit (HOSPITAL_COMMUNITY): Payer: Self-pay

## 2024-09-21 ENCOUNTER — Other Ambulatory Visit (HOSPITAL_COMMUNITY): Payer: Self-pay

## 2024-09-23 ENCOUNTER — Other Ambulatory Visit: Payer: Self-pay

## 2024-09-23 DIAGNOSIS — J4550 Severe persistent asthma, uncomplicated: Secondary | ICD-10-CM

## 2024-09-23 MED ORDER — BREZTRI AEROSPHERE 160-9-4.8 MCG/ACT IN AERO: 2.0000 | INHALATION_SPRAY | Freq: Two times a day (BID) | RESPIRATORY_TRACT | 3 refills | Status: DC

## 2024-09-24 DIAGNOSIS — L570 Actinic keratosis: Secondary | ICD-10-CM | POA: Diagnosis not present

## 2024-09-24 DIAGNOSIS — D485 Neoplasm of uncertain behavior of skin: Secondary | ICD-10-CM | POA: Diagnosis not present

## 2024-09-24 DIAGNOSIS — L820 Inflamed seborrheic keratosis: Secondary | ICD-10-CM | POA: Diagnosis not present

## 2024-09-24 DIAGNOSIS — L720 Epidermal cyst: Secondary | ICD-10-CM | POA: Diagnosis not present

## 2024-09-24 DIAGNOSIS — L821 Other seborrheic keratosis: Secondary | ICD-10-CM | POA: Diagnosis not present

## 2024-09-24 DIAGNOSIS — D1801 Hemangioma of skin and subcutaneous tissue: Secondary | ICD-10-CM | POA: Diagnosis not present

## 2024-09-24 DIAGNOSIS — L738 Other specified follicular disorders: Secondary | ICD-10-CM | POA: Diagnosis not present

## 2024-09-24 DIAGNOSIS — D692 Other nonthrombocytopenic purpura: Secondary | ICD-10-CM | POA: Diagnosis not present

## 2024-09-24 DIAGNOSIS — Z85820 Personal history of malignant melanoma of skin: Secondary | ICD-10-CM | POA: Diagnosis not present

## 2024-09-27 ENCOUNTER — Other Ambulatory Visit (HOSPITAL_COMMUNITY): Payer: Self-pay

## 2024-09-30 ENCOUNTER — Other Ambulatory Visit (HOSPITAL_COMMUNITY): Payer: Self-pay

## 2024-09-30 ENCOUNTER — Other Ambulatory Visit: Payer: Self-pay

## 2024-09-30 ENCOUNTER — Ambulatory Visit (HOSPITAL_BASED_OUTPATIENT_CLINIC_OR_DEPARTMENT_OTHER): Admitting: Obstetrics & Gynecology

## 2024-09-30 DIAGNOSIS — J4550 Severe persistent asthma, uncomplicated: Secondary | ICD-10-CM

## 2024-09-30 MED ORDER — BREZTRI AEROSPHERE 160-9-4.8 MCG/ACT IN AERO
INHALATION_SPRAY | RESPIRATORY_TRACT | 4 refills | Status: AC
  Filled 2024-09-30: qty 10.7, 30d supply, fill #0
  Filled 2024-11-05: qty 32.1, 90d supply, fill #0

## 2024-10-01 ENCOUNTER — Other Ambulatory Visit (HOSPITAL_COMMUNITY): Payer: Self-pay

## 2024-10-05 ENCOUNTER — Other Ambulatory Visit (HOSPITAL_COMMUNITY): Payer: Self-pay

## 2024-10-06 ENCOUNTER — Other Ambulatory Visit (HOSPITAL_COMMUNITY): Payer: Self-pay

## 2024-10-09 ENCOUNTER — Encounter (INDEPENDENT_AMBULATORY_CARE_PROVIDER_SITE_OTHER): Payer: Self-pay | Admitting: Otolaryngology

## 2024-10-09 ENCOUNTER — Ambulatory Visit (INDEPENDENT_AMBULATORY_CARE_PROVIDER_SITE_OTHER): Admitting: Otolaryngology

## 2024-10-09 ENCOUNTER — Other Ambulatory Visit (HOSPITAL_COMMUNITY): Payer: Self-pay

## 2024-10-09 VITALS — BP 144/99 | HR 104 | Temp 97.8°F | Ht 68.0 in | Wt 137.0 lb

## 2024-10-09 DIAGNOSIS — H6993 Unspecified Eustachian tube disorder, bilateral: Secondary | ICD-10-CM | POA: Diagnosis not present

## 2024-10-09 DIAGNOSIS — J329 Chronic sinusitis, unspecified: Secondary | ICD-10-CM | POA: Diagnosis not present

## 2024-10-09 DIAGNOSIS — R059 Cough, unspecified: Secondary | ICD-10-CM

## 2024-10-09 MED ORDER — CLINDAMYCIN HCL 300 MG PO CAPS
300.0000 mg | ORAL_CAPSULE | Freq: Three times a day (TID) | ORAL | 0 refills | Status: AC
  Filled 2024-10-09: qty 30, 10d supply, fill #0

## 2024-10-09 NOTE — Progress Notes (Signed)
 Reason for Consult: Chronic cough Referring Physician: Dr. Maurilio Ronal Jessica Martin is an 69 y.o. female.  HPI: History of a cough.  This has been going on since last year September.  She has a little bit of a white component to the cough with the mucus.  She has had workup by her primary care.  She was given Augmentin .  The cough persisted so she was sent to Dr. Darlean who evaluated her pulmonary status and decided to send her to the allergist Dr. Kozlow.  He did allergy evaluations and did not find any significant allergy problems.  He did order a CT scan of her sinuses which did show bilateral sinusitis in all of the sinuses.  She does feel better at this point relative to the cough but still it is a problem.  She has no significant nasal symptoms of congestion and chronic purulent drainage.  She does have a occasional fullness in the ear that the ears then pop and the fullness goes away.  No hearing loss on a chronic basis.  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   GERD (gastroesophageal reflux disease)    Hx of adenomatous colonic polyps 03/29/2017   Mixed hyperlipidemia    Sinusitis     Past Surgical History:  Procedure Laterality Date   MELANOMA EXCISION  2006   right neck   TONSILLECTOMY AND ADENOIDECTOMY  age 64    Family History  Problem Relation Age of Onset   Lung cancer Mother    Colon cancer Other 53   Colon cancer Brother    Esophageal cancer Neg Hx    Rectal cancer Neg Hx    Stomach cancer Neg Hx     Social History:  reports that she has never smoked. She has never used smokeless tobacco. She reports current alcohol use of about 1.0 standard drink of alcohol per week. She reports that she does not use drugs.  Allergies: Allergies[1]   No results found for this or any previous visit (from the past 48 hours).  No results found.  ROS Blood pressure (!) 144/99, pulse (!) 104, temperature 97.8 F (36.6  C), height 5' 8 (1.727 m), weight 137 lb (62.1 kg), last menstrual period 10/03/2001. Physical Exam Constitutional:      Appearance: Normal appearance.  HENT:     Head: Normocephalic and atraumatic.     Right Ear: Tympanic membrane is without lesions and middle ear aerated, ear canal and external ear normal.     Left Ear: Tympanic membrane is without lesions and middle ear aerated, ear canal and external ear normal.     Nose: Nose without deviation of septum.  Turbinates with mild hypertrophy, No significant swelling or masses.     Oral cavity/oropharynx: Mucous membranes are moist. No lesions or masses    Larynx: normal voice. Mirror attempted without success    Eyes:     Extraocular Movements: Extraocular movements intact.     Conjunctiva/sclera: Conjunctivae normal.     Pupils: Pupils are equal, round, and reactive to light.  Cardiovascular:     Rate and Rhythm: Normal rate.  Pulmonary:     Effort: Pulmonary effort is normal.  Musculoskeletal:     Cervical back: Normal range of motion and neck supple. No rigidity.  Lymphadenopathy:     Cervical: No cervical adenopathy or masses.salivary glands without lesions. .     Salivary glands- no mass  or swelling Neurological:     Mental Status: He is alert. CN 2-12 intact. No nystagmus      Assessment/Plan: Chronic sinusitis-her CT scan does indeed show mucosal thickening in all the sinuses.  The sphenoid is the worst bilaterally.  Whether this is the cause of her cough is difficult to know.  We discussed the issues with sinusitis and cough.  We did discuss another antibiotic and she would like clindamycin .  She will continue her nasal steroid spray.  She will follow-up if the cough persists.  Additionally she will do saline irrigation for her nose twice daily and we discussed this.  Eustachian tube dysfunction-I think the popping is secondary to eustachian tube dysfunction which is from her nasal symptoms and sinusitis.  She will do  Valsalva maneuvers 10 times per day.  [name] Notice 10/09/2024, 9:55 AM        [1]  Allergies Allergen Reactions   Pantoprazole  Anaphylaxis

## 2024-10-10 ENCOUNTER — Other Ambulatory Visit: Payer: Self-pay

## 2024-10-10 ENCOUNTER — Other Ambulatory Visit (HOSPITAL_COMMUNITY): Payer: Self-pay

## 2024-11-05 ENCOUNTER — Other Ambulatory Visit: Payer: Self-pay

## 2024-11-05 ENCOUNTER — Other Ambulatory Visit (HOSPITAL_COMMUNITY): Payer: Self-pay

## 2024-11-07 ENCOUNTER — Encounter (HOSPITAL_COMMUNITY): Payer: Self-pay

## 2024-11-08 ENCOUNTER — Other Ambulatory Visit (HOSPITAL_COMMUNITY): Payer: Self-pay

## 2024-12-17 ENCOUNTER — Ambulatory Visit: Admitting: Allergy and Immunology
